# Patient Record
Sex: Female | Born: 1995
Health system: Southern US, Community
[De-identification: ages and names within clinical notes are randomized; demographics above are authoritative.]

## PROBLEM LIST (undated history)

## (undated) DIAGNOSIS — E282 Polycystic ovarian syndrome: Secondary | ICD-10-CM

## (undated) DIAGNOSIS — E079 Disorder of thyroid, unspecified: Secondary | ICD-10-CM

## (undated) DIAGNOSIS — R51 Headache: Secondary | ICD-10-CM

---
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
## 2014-04-24 ENCOUNTER — Encounter (HOSPITAL_COMMUNITY): Payer: Self-pay | Admitting: Emergency Medicine

## 2014-04-24 ENCOUNTER — Emergency Department (HOSPITAL_COMMUNITY)
Admission: EM | Admit: 2014-04-24 | Discharge: 2014-04-25 | Disposition: A | Discharge status: Home / Self Care | Payer: Managed Care, Other (non HMO) | Attending: Emergency Medicine | Admitting: Emergency Medicine

## 2014-04-24 DIAGNOSIS — H9209 Otalgia, unspecified ear: Secondary | ICD-10-CM | POA: Insufficient documentation

## 2014-04-24 DIAGNOSIS — Z79899 Other long term (current) drug therapy: Secondary | ICD-10-CM | POA: Diagnosis not present

## 2014-04-24 DIAGNOSIS — H9201 Otalgia, right ear: Secondary | ICD-10-CM

## 2014-04-24 NOTE — ED Notes (Signed)
Pain rt ear for 2 weeks

## 2014-04-25 NOTE — ED Provider Notes (Signed)
Medical screening examination/treatment/procedure(s) were performed by non-physician practitioner and as supervising physician I was immediately available for consultation/collaboration.   EKG Interpretation None        Taleigha Pinson, MD 04/25/14 0259 

## 2014-04-25 NOTE — ED Provider Notes (Signed)
CSN: [id number]     Arrival date & time 04/24/14  2238 History   First MD Initiated Contact with Patient 04/25/14 0007     Chief Complaint  Patient presents with  . Otalgia     (Consider location/radiation/quality/duration/timing/severity/associated sxs/prior Treatment) Patient is a 18 y.o. female presenting with ear pain. The history is provided by the patient.  Otalgia Location:  Right Behind ear:  No abnormality Quality:  Dull (Patient states the discomfort feels as though her hand is underwater, and someone is stabbing on a bucket or toe while her head is under the.) Severity:  Moderate Onset quality:  Gradual Duration:  2 weeks Timing:  Intermittent Progression:  Worsening Chronicity:  New Context: not direct blow, not loud noise and no water in ear   Relieved by:  Nothing Worsened by:  Nothing tried Ineffective treatments:  None tried Associated symptoms: no ear discharge, no fever, no headaches, no neck pain and no rhinorrhea   Risk factors: no recent travel     Past Medical History  Diagnosis Date  . Headache(784.0)    History reviewed. No pertinent past surgical history. History reviewed. No pertinent family history. History  Substance Use Topics  . Smoking status: Never Smoker   . Smokeless tobacco: Not on file  . Alcohol Use: No   OB History   Grav Para Term Preterm Abortions TAB SAB Ect Mult Living                 Review of Systems  Constitutional: Negative for fever.  HENT: Positive for ear pain. Negative for ear discharge and rhinorrhea.   Musculoskeletal: Negative for neck pain.  Neurological: Negative for headaches.      Allergies  Review of patient's allergies indicates no known allergies.  Home Medications   Prior to Admission medications   Medication Sig Start Date End Date Taking? Authorizing Provider  albuterol (PROVENTIL HFA) 108 (90 BASE) MCG/ACT inhaler Inhale 2 puffs into the lungs daily. Taken 15 minutes prior to physical activity      Historical Provider, MD  Norgestimate-Ethinyl Estradiol Triphasic (TRI-SPRINTEC) 0.18/0.215/0.25 MG-35 MCG tablet Take 1 tablet by mouth at bedtime.      Historical Provider, MD   BP 117/74  Pulse 86  Temp(Src) 97.9 F (36.6 C) (Oral)  Resp 18  Ht 5' 1.5" (1.562 m)  Wt 149 lb (67.586 kg)  BMI 27.70 kg/m2  SpO2 99%  LMP 04/09/2014 Physical Exam  Nursing note and vitals reviewed. Constitutional: She is oriented to person, place, and time. She appears well-developed and well-nourished.  Non-toxic appearance.  HENT:  Head: Normocephalic.  Right Ear: Tympanic membrane and external ear normal.  Left Ear: Tympanic membrane and external ear normal.  Moderate increase redness of the posterior pharynx. Mild enlargement of the tonsils. No exudate.  Eyes: EOM and lids are normal. Pupils are equal, round, and reactive to light.  Neck: Normal range of motion. Neck supple. Carotid bruit is not present.  Cardiovascular: Normal rate, regular rhythm, normal heart sounds, intact distal pulses and normal pulses.   Pulmonary/Chest: Breath sounds normal. No respiratory distress.  Abdominal: Soft. Bowel sounds are normal. There is no tenderness. There is no guarding.  Musculoskeletal: Normal range of motion.  Lymphadenopathy:       Head (right side): No submandibular adenopathy present.       Head (left side): No submandibular adenopathy present.    She has no cervical adenopathy.  Neurological: She is alert and oriented to person, place, and  time. She has normal strength. No cranial nerve deficit or sensory deficit.  Skin: Skin is warm and dry.  Psychiatric: She has a normal mood and affect. Her speech is normal.    ED Course  Procedures (including critical care time) Labs Review Labs Reviewed - No data to display  Imaging Review No results found.   EKG Interpretation None      MDM Vital signs are well within normal limits. No acute changes of the right ear. There is mod increase  redness of the posterior pharynx and what appears to be some enlargement of the tonsils. Question if the ear discomfort may be related to a eustachian tube problem. I have suggested to the patient that she see an ear nose and throat specialist, I reassured the patient that there is no acute changes or acute problem tonight.    Final diagnoses:  None    *I have reviewed nursing notes, vital signs, and all appropriate lab and imaging results for this patient.Kathie Dike**    Arvo Ealy M Lockie Bothun, PA-C 04/25/14 [phone]

## 2014-04-25 NOTE — Discharge Instructions (Signed)
Salt water gargles maybe helpful three  times daily. please see the ear nose and throat specialist listed above for evaluation and management of this year discomfort. Otalgia Otalgia is pain in or around the ear. When the pain is from the ear itself it is called primary otalgia. Pain may also be coming from somewhere else, like the head and neck. This is called secondary otalgia.  CAUSES  Causes of primary otalgia include:  Middle ear infection.  It can also be caused by injury to the ear or infection of the ear canal (swimmer's ear). Swimmer's ear causes pain, swelling and often drainage from the ear canal. Causes of secondary otalgia include:  Sinus infections.  Allergies and colds that cause stuffiness of the nose and tubes that drain the ears (eustachian tubes).  Dental problems like cavities, gum infections or teething.  Sore Throat (tonsillitis and pharyngitis).  Swollen glands in the neck.  Infection of the bone behind the ear (mastoiditis).  TMJ discomfort (problems with the joint between your jaw and your skull).  Other problems such as nerve disorders, circulation problems, heart disease and tumors of the head and neck can also cause symptoms of ear pain. This is rare. DIAGNOSIS  Evaluation, Diagnosis and Testing:  Examination by your medical caregiver is recommended to evaluate and diagnose the cause of otalgia.  Further testing or referral to a specialist may be indicated if the cause of the ear pain is not found and the symptom persists. TREATMENT   Your doctor may prescribe antibiotics if an ear infection is diagnosed.  Pain relievers and topical analgesics may be recommended.  It is important to take all medications as prescribed. HOME CARE INSTRUCTIONS   It may be helpful to sleep with the painful ear in the up position.  A warm compress over the painful ear may provide relief.  A soft diet and avoiding gum may help while ear pain is present. SEEK  IMMEDIATE MEDICAL CARE IF:  You develop severe pain, a high fever, repeated vomiting or dehydration.  You develop extreme dizziness, headache, confusion, ringing in the ears (tinnitus) or hearing loss. Document Released: 11/12/2004 Document Revised: 12/28/2011 Document Reviewed: 08/14/2009 [facility] Patient Information 2015 [employer], [place]. This information is not intended to replace advice given to you by your health care provider. Make sure you discuss any questions you have with your health care provider.

## 2014-09-11 ENCOUNTER — Encounter: Payer: Self-pay | Admitting: *Deleted

## 2015-02-06 IMAGING — US US PELVIS COMPLETE
1 series · 13 of 25 positions shown · non-contrast
Comparison: Pelvic ultrasound, 03/23/2011

CLINICAL DATA: The left lower quadrant pain.  History of left
ovarian cyst.

TRANSABDOMINAL AND TRANSVAGINAL ULTRASOUND OF PELVIS
TECHNIQUE: Both transabdominal and transvaginal ultrasound
examinations of the pelvis were performed. Transabdominal technique
was performed for global imaging of the pelvis including uterus,
ovaries, adnexal regions, and pelvic cul-de-sac.
It was necessary to proceed with endovaginal exam following the
transabdominal exam to visualize the uterus and ovaries to better
advantage.

[Series 1: us pelvis complete · 0.22mm/px · 13 of 74 slices shown]
[im 1/74]
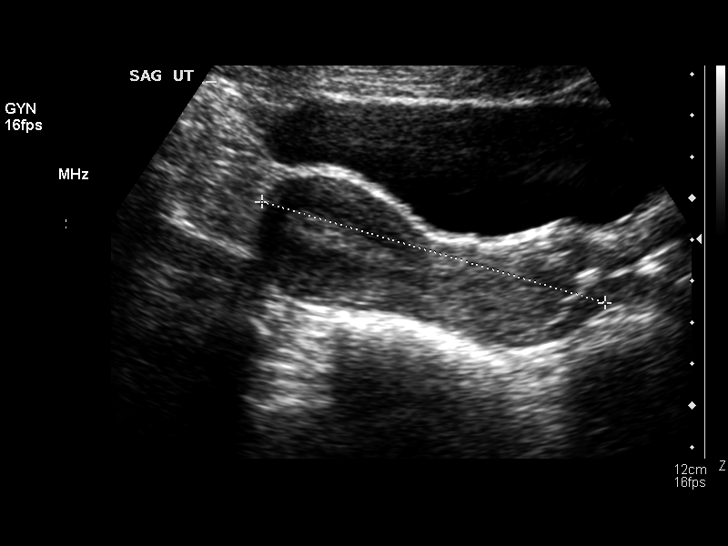
[im 7/74]
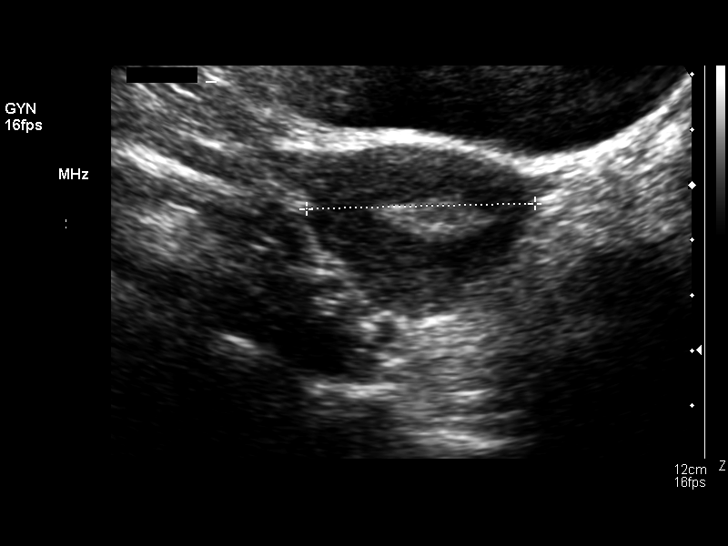
[im 13/74]
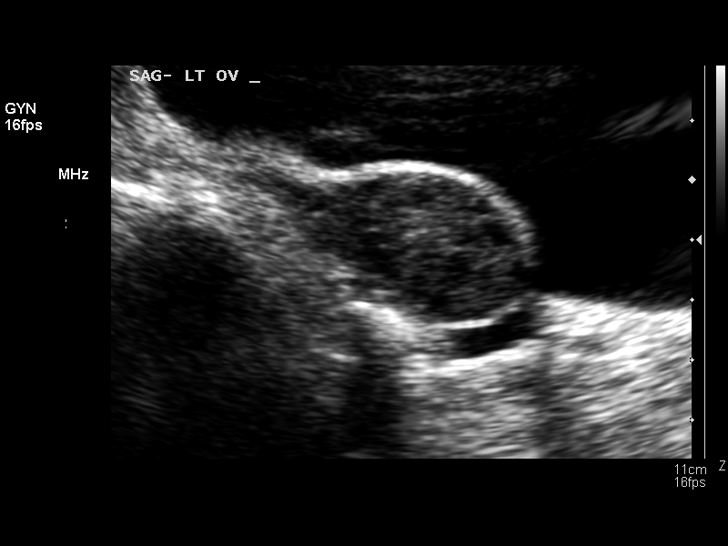
[im 19/74]
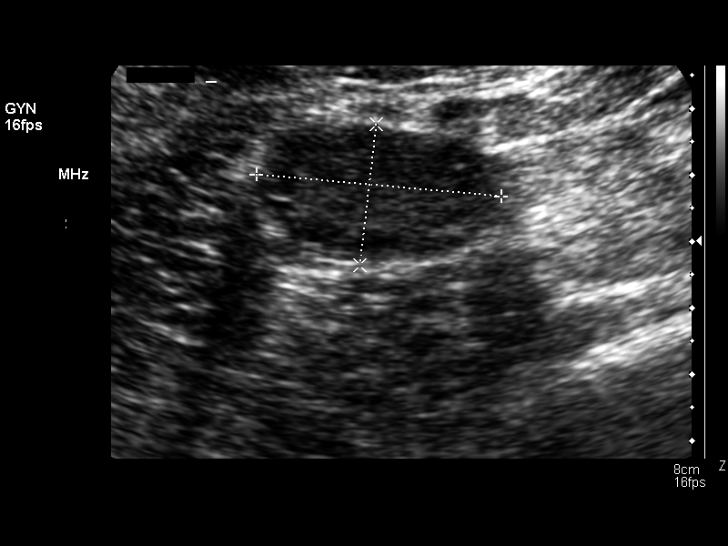
[im 25/74]
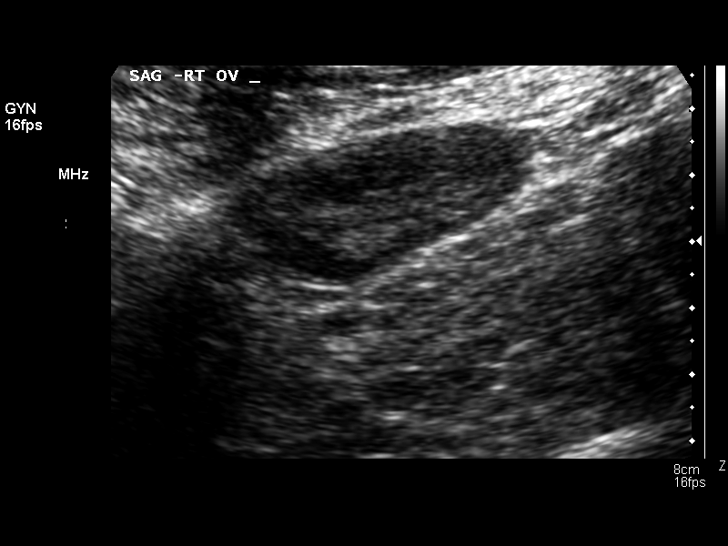
[im 31/74]
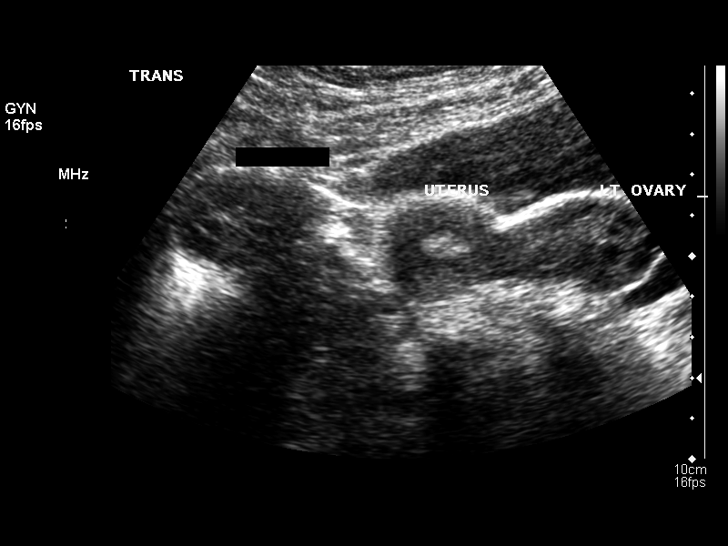
[im 37/74]
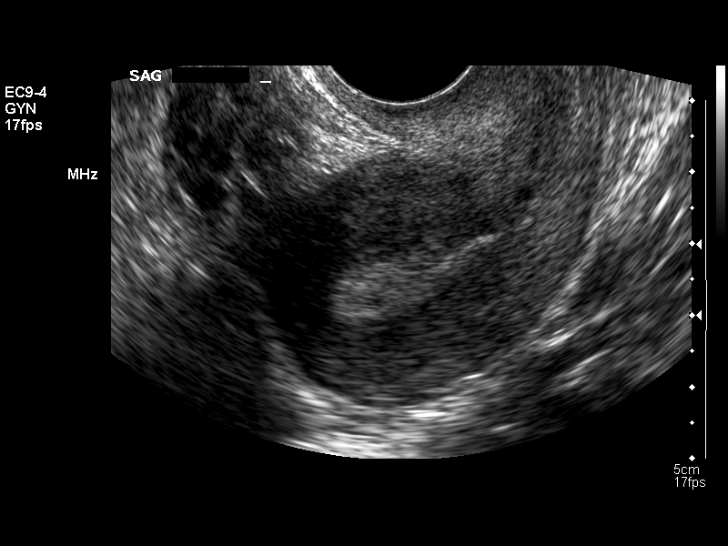
[im 43/74]
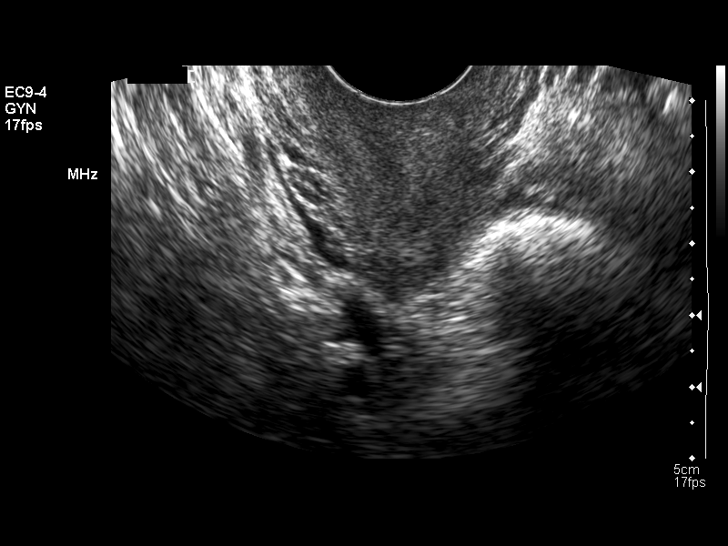
[im 49/74]
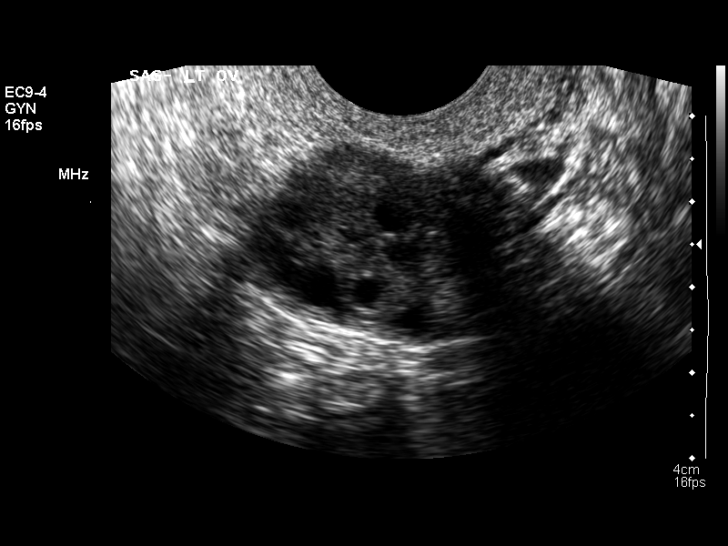
[im 55/74]
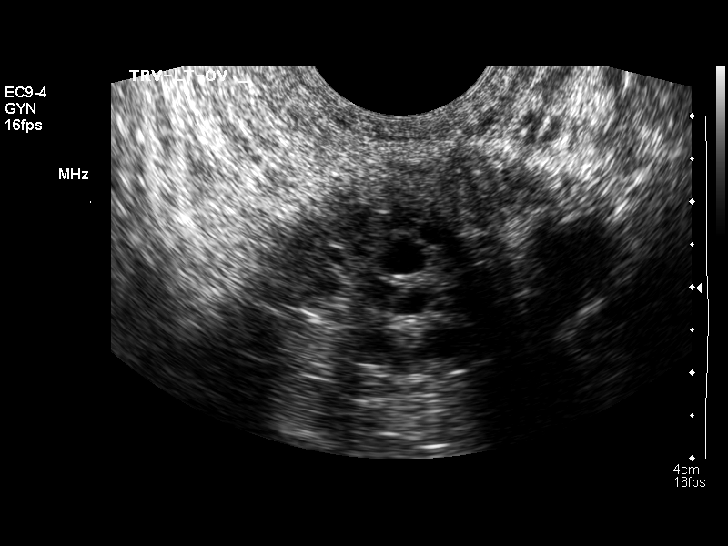
[im 61/74]
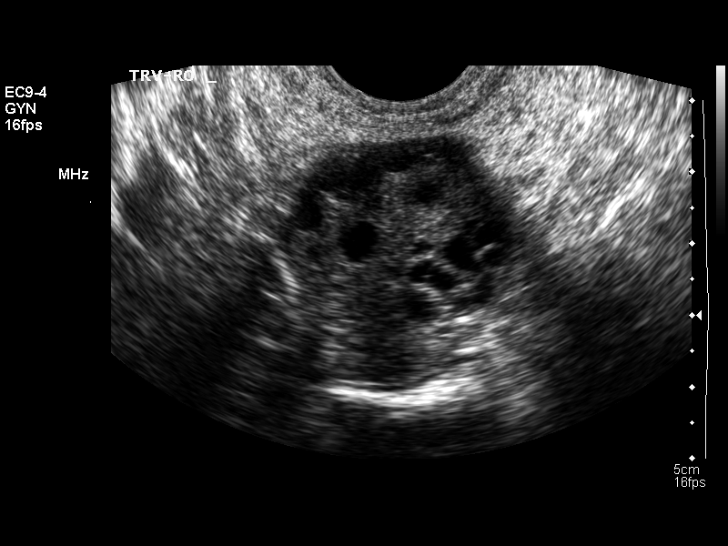
[im 67/74]
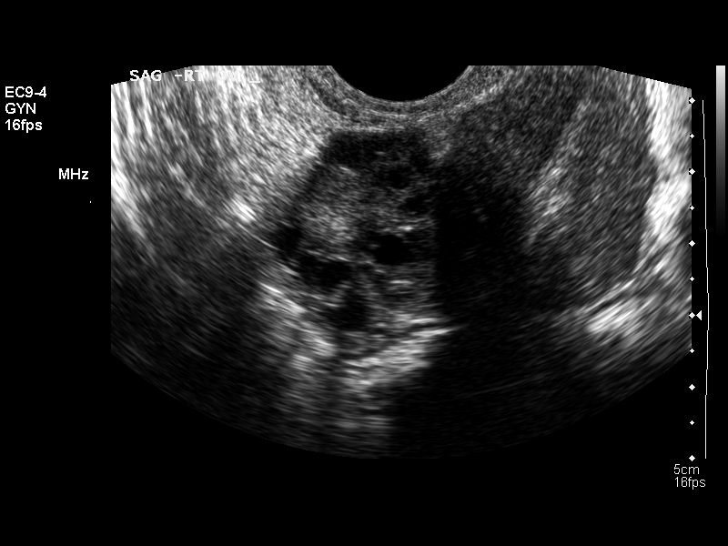
[im 74/74]
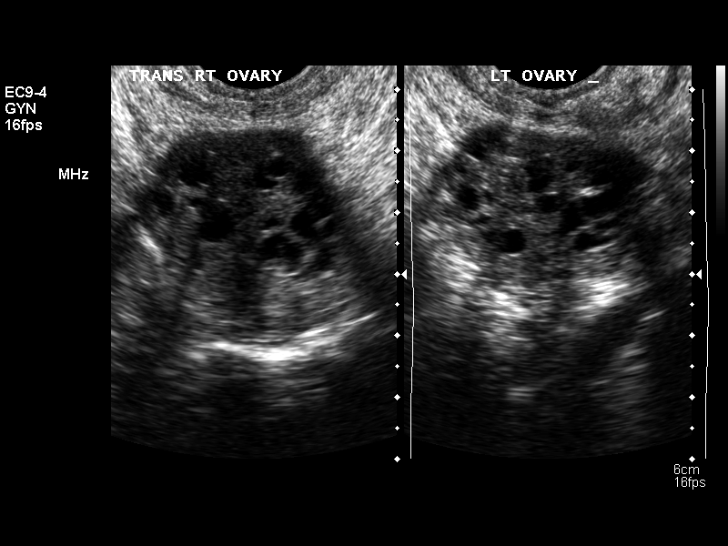

[13 of 25 positions shown; findings below may reference images not displayed]

FINDINGS: Uterus: The uterus is normal in size and echogenicity with no
masses.  The cervix is unremarkable.  The uterus measures 6.8 cm x
3.7 cm x 4.0 cm.

Endometrium: Normal in thickness and smooth measuring 10.8 mm.

Right ovary:  Normal in size and echogenicity.  There are multiple
small follicular cysts.  Normal blood flow is seen by color Doppler
analysis.  The ovary measures 33 mm x 37 mm x 27 mm.

Left ovary: Normal in size and echogenicity.  Multiple small
follicular cysts are noted similar to the right ovary.  Normal
blood flow is seen by color Doppler analysis.  The ovary measures
41 mm x 26 mm x 28 mm.

Other findings: Trace free fluid, physiologic.
IMPRESSION: Normal transabdominal and endovaginal pelvic ultrasound..

## 2017-01-14 DIAGNOSIS — Z0000 Encounter for general adult medical examination without abnormal findings: Secondary | ICD-10-CM | POA: Diagnosis not present

## 2017-01-14 DIAGNOSIS — Z23 Encounter for immunization: Secondary | ICD-10-CM | POA: Diagnosis not present

## 2017-02-02 DIAGNOSIS — Z124 Encounter for screening for malignant neoplasm of cervix: Secondary | ICD-10-CM | POA: Diagnosis not present

## 2017-02-02 DIAGNOSIS — L680 Hirsutism: Secondary | ICD-10-CM | POA: Diagnosis not present

## 2017-02-02 DIAGNOSIS — Z6828 Body mass index (BMI) 28.0-28.9, adult: Secondary | ICD-10-CM | POA: Diagnosis not present

## 2017-02-02 DIAGNOSIS — N938 Other specified abnormal uterine and vaginal bleeding: Secondary | ICD-10-CM | POA: Diagnosis not present

## 2017-03-03 DIAGNOSIS — E282 Polycystic ovarian syndrome: Secondary | ICD-10-CM | POA: Diagnosis not present

## 2017-03-18 DIAGNOSIS — E039 Hypothyroidism, unspecified: Secondary | ICD-10-CM | POA: Diagnosis not present

## 2018-05-25 DIAGNOSIS — L309 Dermatitis, unspecified: Secondary | ICD-10-CM | POA: Diagnosis not present

## 2018-05-25 DIAGNOSIS — B372 Candidiasis of skin and nail: Secondary | ICD-10-CM | POA: Diagnosis not present

## 2018-06-28 DIAGNOSIS — Z01419 Encounter for gynecological examination (general) (routine) without abnormal findings: Secondary | ICD-10-CM | POA: Diagnosis not present

## 2018-06-28 DIAGNOSIS — E039 Hypothyroidism, unspecified: Secondary | ICD-10-CM | POA: Diagnosis not present

## 2018-06-28 DIAGNOSIS — Z6828 Body mass index (BMI) 28.0-28.9, adult: Secondary | ICD-10-CM | POA: Diagnosis not present

## 2018-08-16 DIAGNOSIS — E039 Hypothyroidism, unspecified: Secondary | ICD-10-CM | POA: Diagnosis not present

## 2019-12-03 ENCOUNTER — Encounter (HOSPITAL_COMMUNITY): Payer: Self-pay | Admitting: *Deleted

## 2019-12-03 ENCOUNTER — Emergency Department (HOSPITAL_COMMUNITY)
Admission: EM | Admit: 2019-12-03 | Discharge: 2019-12-03 | Disposition: A | Discharge status: Home / Self Care | Payer: No Typology Code available for payment source | Attending: Emergency Medicine | Admitting: Emergency Medicine

## 2019-12-03 ENCOUNTER — Other Ambulatory Visit: Payer: Self-pay

## 2019-12-03 DIAGNOSIS — Z793 Long term (current) use of hormonal contraceptives: Secondary | ICD-10-CM | POA: Insufficient documentation

## 2019-12-03 DIAGNOSIS — M545 Low back pain: Secondary | ICD-10-CM | POA: Diagnosis present

## 2019-12-03 DIAGNOSIS — R519 Headache, unspecified: Secondary | ICD-10-CM | POA: Insufficient documentation

## 2019-12-03 DIAGNOSIS — Y92410 Unspecified street and highway as the place of occurrence of the external cause: Secondary | ICD-10-CM | POA: Diagnosis not present

## 2019-12-03 DIAGNOSIS — Y999 Unspecified external cause status: Secondary | ICD-10-CM | POA: Diagnosis not present

## 2019-12-03 DIAGNOSIS — Y93I9 Activity, other involving external motion: Secondary | ICD-10-CM | POA: Diagnosis not present

## 2019-12-03 MED ORDER — METHOCARBAMOL 500 MG PO TABS: 500.0000 mg | ORAL_TABLET | Freq: Three times a day (TID) | ORAL | 0 refills | Status: DC | PRN

## 2019-12-03 MED ORDER — NAPROXEN 500 MG PO TABS: 500.0000 mg | ORAL_TABLET | Freq: Two times a day (BID) | ORAL | 0 refills | Status: DC
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
## 2021-01-15 DIAGNOSIS — Z6828 Body mass index (BMI) 28.0-28.9, adult: Secondary | ICD-10-CM | POA: Diagnosis not present

## 2021-01-15 DIAGNOSIS — Z01419 Encounter for gynecological examination (general) (routine) without abnormal findings: Secondary | ICD-10-CM | POA: Diagnosis not present

## 2021-01-15 DIAGNOSIS — Z124 Encounter for screening for malignant neoplasm of cervix: Secondary | ICD-10-CM | POA: Diagnosis not present

## 2021-01-15 DIAGNOSIS — E039 Hypothyroidism, unspecified: Secondary | ICD-10-CM | POA: Diagnosis not present

## 2021-01-17 DIAGNOSIS — M79639 Pain in unspecified forearm: Secondary | ICD-10-CM | POA: Diagnosis not present

## 2021-01-21 DIAGNOSIS — S5001XA Contusion of right elbow, initial encounter: Secondary | ICD-10-CM | POA: Diagnosis not present

## 2021-04-03 DIAGNOSIS — Z03818 Encounter for observation for suspected exposure to other biological agents ruled out: Secondary | ICD-10-CM | POA: Diagnosis not present

## 2021-04-03 DIAGNOSIS — J069 Acute upper respiratory infection, unspecified: Secondary | ICD-10-CM | POA: Diagnosis not present

## 2021-04-03 DIAGNOSIS — R0981 Nasal congestion: Secondary | ICD-10-CM | POA: Diagnosis not present

## 2021-04-05 ENCOUNTER — Emergency Department (HOSPITAL_COMMUNITY)
Admission: EM | Admit: 2021-04-05 | Discharge: 2021-04-05 | Disposition: A | Discharge status: Home / Self Care | Payer: BC Managed Care – PPO | Attending: Emergency Medicine | Admitting: Emergency Medicine

## 2021-04-05 ENCOUNTER — Other Ambulatory Visit: Payer: Self-pay

## 2021-04-05 ENCOUNTER — Encounter (HOSPITAL_COMMUNITY): Payer: Self-pay | Admitting: Emergency Medicine

## 2021-04-05 DIAGNOSIS — T50901A Poisoning by unspecified drugs, medicaments and biological substances, accidental (unintentional), initial encounter: Secondary | ICD-10-CM

## 2021-04-05 DIAGNOSIS — T391X1A Poisoning by 4-Aminophenol derivatives, accidental (unintentional), initial encounter: Secondary | ICD-10-CM | POA: Insufficient documentation

## 2021-04-05 DIAGNOSIS — X58XXXA Exposure to other specified factors, initial encounter: Secondary | ICD-10-CM | POA: Insufficient documentation

## 2021-04-05 DIAGNOSIS — R42 Dizziness and giddiness: Secondary | ICD-10-CM | POA: Diagnosis not present

## 2021-04-05 DIAGNOSIS — R000 Tachycardia, unspecified: Secondary | ICD-10-CM | POA: Insufficient documentation

## 2021-04-05 HISTORY — DX: Disorder of thyroid, unspecified: E07.9

## 2021-04-05 HISTORY — DX: Polycystic ovarian syndrome: E28.2

## 2021-04-05 LAB — ACETAMINOPHEN LEVEL: Acetaminophen (Tylenol), Serum: <10 ug/mL — ABNORMAL LOW (ref 10–30)

## 2021-04-05 NOTE — ED Provider Notes (Signed)
[facility] EMERGENCY DEPARTMENT Provider Note   CSN: [id number] Arrival date & time: 04/05/21  1848     History Chief Complaint  Patient presents with   Medication Reaction    Gail Lee is a 25 y.o. female.  HPI  Patient with significant medical history of headaches, PCOS, thyroid disease presents with chief complaint of overdosing on medication.  Patient states she has been taking Tylenol and Mucinex for a URI.  Patient states she she has been trying to take them every 4 hours as directed.  Unfortunately she accidentally double dosed on Tylenol and Mucinex.  Patient states today she had a total of 2000 mg of Tylenol, states that she took 2  (500 mg tablets) within an hour time span and and then took 2 over-the-counter Mucinex which is (400 mg of Guaifensin mixed with 20mg  of dextromethorphan) within an hour time period.  Patient states this occurred around 4 PM today, states that initially she felt lightheaded, and dizziness and felt that her mouth was very dry.  Patient states this has since resolved and has no other complaints at this time.  Patient states this was accidental and did not mean to harm her self.  Patient denies  relieving factors.  Patient denies headaches, fevers, chills, shortness of breath, chest pain, abdominal pain, nausea or vomiting.  Past Medical History:  Diagnosis Date   Headache(784.0)    PCOS (polycystic ovarian syndrome)    Thyroid disease     There are no problems to display for this patient.   History reviewed. No pertinent surgical history.   OB History   No obstetric history on file.     No family history on file.  Social History   Tobacco Use   Smoking status: Never   Smokeless tobacco: Never  Vaping Use   Vaping Use: Never used  Substance Use Topics   Alcohol use: No   Drug use: No    Home Medications Prior to Admission medications   Medication Sig Start Date End Date Taking? Authorizing Provider  albuterol (PROVENTIL  HFA) 108 (90 BASE) MCG/ACT inhaler Inhale 2 puffs into the lungs daily. Taken 15 minutes prior to physical activity     [provider]  methocarbamol (ROBAXIN) 500 MG tablet Take 1 tablet (500 mg total) by mouth every 8 (eight) hours as needed for muscle spasms. 12/03/19   Petrucelli, Samantha R, PA-C  naproxen (NAPROSYN) 500 MG tablet Take 1 tablet (500 mg total) by mouth 2 (two) times daily. 12/03/19   Petrucelli, Samantha R, PA-C  Norgestimate-Ethinyl Estradiol Triphasic (TRI-SPRINTEC) 0.18/0.215/0.25 MG-35 MCG tablet Take 1 tablet by mouth at bedtime.      [provider]    Allergies    Patient has no known allergies.  Review of Systems   Review of Systems  Constitutional:  Negative for chills and fever.  HENT:  Negative for congestion.   Respiratory:  Negative for shortness of breath.   Cardiovascular:  Negative for chest pain.  Gastrointestinal:  Negative for abdominal pain.  Genitourinary:  Negative for enuresis.  Musculoskeletal:  Negative for back pain.  Skin:  Negative for rash.  Neurological:  Negative for dizziness.  Hematological:  Does not bruise/bleed easily.   Physical Exam Updated Vital Signs BP 126/85 (BP Location: Right Arm)   Pulse (!) 102   Temp 98.2 F (36.8 C)   Resp 16   Ht 5\' 2"  (1.575 m)   Wt 71.7 kg   LMP 03/02/2021  SpO2 99%   BMI 28.91 kg/m   Physical Exam Vitals and nursing note reviewed.  Constitutional:      General: She is not in acute distress.    Appearance: Normal appearance. She is not ill-appearing or diaphoretic.  HENT:     Head: Normocephalic and atraumatic.     Nose: No congestion or rhinorrhea.  Eyes:     Conjunctiva/sclera: Conjunctivae normal.  Cardiovascular:     Rate and Rhythm: Regular rhythm. Tachycardia present.  Pulmonary:     Effort: Pulmonary effort is normal.  Musculoskeletal:        General: Normal range of motion.     Cervical back: Neck supple.  Skin:    General: Skin is warm and dry.      Coloration: Skin is not jaundiced.  Neurological:     Mental Status: She is alert and oriented to person, place, and time.  Psychiatric:        Mood and Affect: Mood normal.    ED Results / Procedures / Treatments   Labs (all labs ordered are listed, but only abnormal results are displayed) Labs Reviewed  ACETAMINOPHEN LEVEL - Abnormal; Notable for the following components:      Result Value   Acetaminophen (Tylenol), Serum <10 (*)    All other components within normal limits    EKG None  Radiology No results found.  Procedures Procedures   Medications Ordered in ED Medications - No data to display  ED Course  I have reviewed the triage vital signs and the nursing notes.  Pertinent labs & imaging results that were available during my care of the patient were reviewed by me and considered in my medical decision making (see chart for details).    MDM Rules/Calculators/A&P                         Initial impression-patient presents with concerns of possible overdose.  She is alert, does not appear in acute stress, vital signs notable for tachycardia.  Will consult with poison control for further recommendations.  Work-up-Tylenol level was less than 10  Consult-spoke with Michael from poison control, he states this is well below the toxic level for either  medication, he recommend obtaining a Tylenol level, and advise if greater than 100 to call back for further recommendations.  If negative patient can be discharged home.  Rule out-I have low suspicion for suicidal or homicidal ideations patient denies this, low suspicion for acute liver injury as Tylenol level is within normal limits, patient is nonjaundiced on my exam, she denies any abdominal pain.  Low suspicion for systemic infection as patient is nontoxic-appearing, vital signs reassuring.  Patient is tachycardic but she states she is anxious,  suspect this is secondary due to whitecoat syndrome.  Plan- Accidental  overdose since resolved- will educate patient on over-the-counter medication, and how to contact poison control if needed, patient to follow-up with her PCP as needed.  Vital signs have remained stable, no indication for hospital admission.  Patient discussed with attending and they agreed with assessment and plan.  Patient given at home care as well strict return precautions.  Patient verbalized that they understood agreed to said plan.  Final Clinical Impression(s) / ED Diagnoses Final diagnoses:  Medication administered in error, accidental or unintentional, initial encounter    Rx / DC Orders ED Discharge Orders     None        Carroll Sage, PA-C 04/05/21  2250    Vanetta Mulders, MD 04/11/21 [phone]

## 2021-04-05 NOTE — Discharge Instructions (Addendum)
Lab work is reassuring.  Please continue with medications as prescribed.  If questions about actions of poison COVID is can contact poison control.  Come back to the emergency department if you develop chest pain, shortness of breath, severe abdominal pain, uncontrolled nausea, vomiting, diarrhea.

## 2021-04-05 NOTE — ED Triage Notes (Signed)
Pt states that she thinks that she "double dosed herself on her Guaifenesin medication and states she took 1G of tylenol within 2-3 hours." Pt c/o an episode of dizziness and instant dry mouth.

## 2021-07-05 ENCOUNTER — Encounter (HOSPITAL_COMMUNITY): Payer: Self-pay | Admitting: *Deleted

## 2021-07-05 ENCOUNTER — Emergency Department (HOSPITAL_COMMUNITY): Payer: BC Managed Care – PPO

## 2021-07-05 ENCOUNTER — Observation Stay (HOSPITAL_COMMUNITY)
Admission: EM | Admit: 2021-07-05 | Discharge: 2021-07-06 | Disposition: A | Discharge status: Home / Self Care | Payer: BC Managed Care – PPO | Attending: General Surgery | Admitting: General Surgery

## 2021-07-05 ENCOUNTER — Other Ambulatory Visit: Payer: Self-pay

## 2021-07-05 DIAGNOSIS — E039 Hypothyroidism, unspecified: Secondary | ICD-10-CM | POA: Insufficient documentation

## 2021-07-05 DIAGNOSIS — K353 Acute appendicitis with localized peritonitis, without perforation or gangrene: Secondary | ICD-10-CM | POA: Diagnosis not present

## 2021-07-05 DIAGNOSIS — Z20822 Contact with and (suspected) exposure to covid-19: Secondary | ICD-10-CM | POA: Insufficient documentation

## 2021-07-05 DIAGNOSIS — R109 Unspecified abdominal pain: Secondary | ICD-10-CM | POA: Diagnosis not present

## 2021-07-05 DIAGNOSIS — K3580 Unspecified acute appendicitis: Secondary | ICD-10-CM | POA: Diagnosis not present

## 2021-07-05 DIAGNOSIS — Z79899 Other long term (current) drug therapy: Secondary | ICD-10-CM | POA: Diagnosis not present

## 2021-07-05 DIAGNOSIS — R102 Pelvic and perineal pain: Secondary | ICD-10-CM | POA: Diagnosis not present

## 2021-07-05 DIAGNOSIS — R1031 Right lower quadrant pain: Secondary | ICD-10-CM | POA: Diagnosis not present

## 2021-07-05 DIAGNOSIS — K37 Unspecified appendicitis: Secondary | ICD-10-CM | POA: Diagnosis present

## 2021-07-05 LAB — COMPREHENSIVE METABOLIC PANEL
ALT: 42 U/L (ref 0–44)
AST: 26 U/L (ref 15–41)
Albumin: 4.9 g/dL (ref 3.5–5.0)
Alkaline Phosphatase: 87 U/L (ref 38–126)
Anion gap: 9 (ref 5–15)
BUN: 10 mg/dL (ref 6–20)
CO2: 23 mmol/L (ref 22–32)
Calcium: 9 mg/dL (ref 8.9–10.3)
Chloride: 102 mmol/L (ref 98–111)
Creatinine, Ser: 0.62 mg/dL (ref 0.44–1.00)
GFR, Estimated: >60 mL/min (ref >60)
Glucose, Bld: 110 mg/dL — ABNORMAL HIGH (ref 70–99)
Potassium: 3.4 mmol/L — ABNORMAL LOW (ref 3.5–5.1)
Sodium: 134 mmol/L — ABNORMAL LOW (ref 135–145)
Total Bilirubin: 0.6 mg/dL (ref 0.3–1.2)
Total Protein: 7.9 g/dL (ref 6.5–8.1)

## 2021-07-05 LAB — CBC
HCT: 47.9 % — ABNORMAL HIGH (ref 36.0–46.0)
Hemoglobin: 15.8 g/dL — ABNORMAL HIGH (ref 12.0–15.0)
MCH: 32.7 pg (ref 26.0–34.0)
MCHC: 33 g/dL (ref 30.0–36.0)
MCV: 99.2 fL (ref 80.0–100.0)
Platelets: 286 10*3/uL (ref 150–400)
RBC: 4.83 MIL/uL (ref 3.87–5.11)
RDW: 11.5 % (ref 11.5–15.5)
WBC: 16.6 10*3/uL — ABNORMAL HIGH (ref 4.0–10.5)
nRBC: 0 % (ref 0.0–0.2)

## 2021-07-05 LAB — LIPASE, BLOOD: Lipase: 23 U/L (ref 11–51)

## 2021-07-05 LAB — POC URINE PREG, ED: Preg Test, Ur: NEGATIVE

## 2021-07-05 MED ORDER — MORPHINE SULFATE (PF) 4 MG/ML IV SOLN
4.0000 mg | Freq: Once | INTRAVENOUS | Status: AC
  Administered 2021-07-05: 4 mg via INTRAVENOUS
  Filled 2021-07-05: qty 1

## 2021-07-05 MED ORDER — METRONIDAZOLE 500 MG/100ML IV SOLN
500.0000 mg | Freq: Once | INTRAVENOUS | Status: AC
  Administered 2021-07-05: 500 mg via INTRAVENOUS
  Filled 2021-07-05: qty 100

## 2021-07-05 MED ORDER — MORPHINE SULFATE (PF) 4 MG/ML IV SOLN
4.0000 mg | INTRAVENOUS | Status: DC | PRN
  Administered 2021-07-05 – 2021-07-06 (×2): 4 mg via INTRAVENOUS
  Filled 2021-07-05 (×2): qty 1

## 2021-07-05 MED ORDER — SODIUM CHLORIDE 0.9 % IV SOLN
1.0000 g | Freq: Once | INTRAVENOUS | Status: AC
  Administered 2021-07-05: 1 g via INTRAVENOUS
  Filled 2021-07-05: qty 10

## 2021-07-05 MED ORDER — SODIUM CHLORIDE 0.9 % IV SOLN: INTRAVENOUS | Status: DC

## 2021-07-05 MED ORDER — IOHEXOL 350 MG/ML SOLN
80.0000 mL | Freq: Once | INTRAVENOUS | Status: AC | PRN
  Administered 2021-07-05: 80 mL via INTRAVENOUS

## 2021-07-05 MED ORDER — ONDANSETRON HCL 4 MG/2ML IJ SOLN
4.0000 mg | Freq: Once | INTRAMUSCULAR | Status: AC
  Administered 2021-07-05: 4 mg via INTRAVENOUS
  Filled 2021-07-05: qty 2

## 2021-07-05 MED ORDER — PIPERACILLIN-TAZOBACTAM 3.375 G IVPB
3.3750 g | Freq: Three times a day (TID) | INTRAVENOUS | Status: DC
  Administered 2021-07-06 (×2): 3.375 g via INTRAVENOUS
  Filled 2021-07-05 (×3): qty 50

## 2021-07-05 MED ORDER — LACTATED RINGERS IV BOLUS
1000.0000 mL | Freq: Once | INTRAVENOUS | Status: AC
  Administered 2021-07-05: 1000 mL via INTRAVENOUS

## 2021-07-05 MED ORDER — KETOROLAC TROMETHAMINE 30 MG/ML IJ SOLN
15.0000 mg | Freq: Once | INTRAMUSCULAR | Status: AC
  Administered 2021-07-05: 15 mg via INTRAVENOUS
  Filled 2021-07-05: qty 1

## 2021-07-05 NOTE — ED Provider Notes (Signed)
[facility] EMERGENCY DEPARTMENT Provider Note   CSN: [id number] Arrival date & time: 07/05/21  1651     History Chief Complaint  Patient presents with   Abdominal Pain    Gail Lee is a 25 y.o. female PMH PCOS who presents to the emergency department for evaluation of abdominal pain.  Abdominal pain began this morning and acutely worsened at 11 AM with associated nausea but no vomiting.  Nonradiating, 10 out of 10 located in the right lower quadrant in the suprapubic area.  Patient endorses intermittent vaginal spotting which she says is common for her in the setting of her PCOS.  Denies chest pain, shortness of breath, fever, cough, headache or other systemic symptoms.  Denies dysuria.   Abdominal Pain Associated symptoms: nausea   Associated symptoms: no chest pain, no chills, no cough, no dysuria, no fever, no hematuria, no shortness of breath, no sore throat and no vomiting       Past Medical History:  Diagnosis Date   Headache(784.0)    PCOS (polycystic ovarian syndrome)    Thyroid disease     There are no problems to display for this patient.   History reviewed. No pertinent surgical history.   OB History   No obstetric history on file.     No family history on file.  Social History   Tobacco Use   Smoking status: Never   Smokeless tobacco: Never  Vaping Use   Vaping Use: Never used  Substance Use Topics   Alcohol use: No   Drug use: No    Home Medications Prior to Admission medications   Medication Sig Start Date End Date Taking? Authorizing Provider  levothyroxine (SYNTHROID) 50 MCG tablet Take 50 mcg by mouth daily before breakfast.   Yes [provider]    Allergies    Patient has no known allergies.  Review of Systems   Review of Systems  Constitutional:  Negative for chills and fever.  HENT:  Negative for ear pain and sore throat.   Eyes:  Negative for pain and visual disturbance.  Respiratory:  Negative for cough and  shortness of breath.   Cardiovascular:  Negative for chest pain and palpitations.  Gastrointestinal:  Positive for abdominal pain and nausea. Negative for vomiting.  Genitourinary:  Negative for dysuria and hematuria.  Musculoskeletal:  Negative for arthralgias and back pain.  Skin:  Negative for color change and rash.  Neurological:  Negative for seizures and syncope.  All other systems reviewed and are negative.  Physical Exam Updated Vital Signs BP 117/88   Pulse (!) 115   Temp (!) 97.4 F (36.3 C)   Resp 20   Ht 5\' 1"  (1.549 m)   Wt 70.1 kg   LMP 06/20/2021 (Exact Date)   SpO2 100%   BMI 29.20 kg/m   Physical Exam Vitals and nursing note reviewed.  Constitutional:      General: She is not in acute distress.    Appearance: She is well-developed.  HENT:     Head: Normocephalic and atraumatic.  Eyes:     Conjunctiva/sclera: Conjunctivae normal.  Cardiovascular:     Rate and Rhythm: Normal rate and regular rhythm.     Heart sounds: No murmur heard. Pulmonary:     Effort: Pulmonary effort is normal. No respiratory distress.     Breath sounds: Normal breath sounds.  Abdominal:     Palpations: Abdomen is soft.     Tenderness: There is abdominal tenderness in the right  lower quadrant and suprapubic area.  Musculoskeletal:     Cervical back: Neck supple.  Skin:    General: Skin is warm and dry.  Neurological:     Mental Status: She is alert.    ED Results / Procedures / Treatments   Labs (all labs ordered are listed, but only abnormal results are displayed) Labs Reviewed  COMPREHENSIVE METABOLIC PANEL - Abnormal; Notable for the following components:      Result Value   Sodium 134 (*)    Potassium 3.4 (*)    Glucose, Bld 110 (*)    All other components within normal limits  CBC - Abnormal; Notable for the following components:   WBC 16.6 (*)    Hemoglobin 15.8 (*)    HCT 47.9 (*)    All other components within normal limits  LIPASE, BLOOD  URINALYSIS,  ROUTINE W REFLEX MICROSCOPIC  POC URINE PREG, ED  I-STAT BETA HCG BLOOD, ED ([facility], [facility], [facility] ONLY)    EKG None  Radiology US Transvaginal Non-OB  Result Date: 07/05/2021 CLINICAL DATA:  Rule out torsion.  Pelvic pain for 1 day. EXAM: TRANSABDOMINAL AND TRANSVAGINAL ULTRASOUND OF PELVIS DOPPLER ULTRASOUND OF OVARIES TECHNIQUE: Both transabdominal and transvaginal ultrasound examinations of the pelvis were performed. Transabdominal technique was performed for global imaging of the pelvis including uterus, ovaries, adnexal regions, and pelvic cul-de-sac. It was necessary to proceed with endovaginal exam following the transabdominal exam to visualize the ovaries and endometrium. Color and duplex Doppler ultrasound was utilized to evaluate blood flow to the ovaries. COMPARISON:  05/30/2013 FINDINGS: Uterus Measurements: 7.3 x 3.3 x 4 cm = volume: 50 mL. Anteverted uterus. No myometrial mass or abnormality identified. Endometrium Thickness: 11.5 mm.  No focal abnormality visualized. Right ovary Measurements: 4.4 x 3.1 x 4.2 cm = volume: 30 mL. Right ovary is prominent in size with multiple follicles arranged peripherally consistent with history of polycystic ovary disease. No dominant mass identified. Flow is demonstrated in the right ovary on color flow Doppler imaging. Left ovary Measurements: 5.2 x 2.8 x 3.7 cm = volume: 28 mL. Left ovary is prominent in size with multiple follicles arranged peripherally consistent with history of polycystic ovarian disease. No dominant mass identified. Flow is demonstrated in the left ovary on color flow Doppler imaging. Pulsed Doppler evaluation of both ovaries demonstrates normal low-resistance arterial and venous waveforms. Other findings Small amount of free fluid in the cul-de-sac and around the ovaries. This is nonspecific but likely physiologic. IMPRESSION: 1. Appearance and configuration of the ovaries is consistent with history of polycystic ovarian disease. No evidence  of abnormal mass or torsion. 2. Small amount of free fluid is likely physiologic. 3. Normal ultrasound appearance of the uterus. Electronically Signed   By: Burman Nieves M.D.   On: 07/05/2021 21:39   US Pelvis Complete  Result Date: 07/05/2021 CLINICAL DATA:  Rule out torsion.  Pelvic pain for 1 day. EXAM: TRANSABDOMINAL AND TRANSVAGINAL ULTRASOUND OF PELVIS DOPPLER ULTRASOUND OF OVARIES TECHNIQUE: Both transabdominal and transvaginal ultrasound examinations of the pelvis were performed. Transabdominal technique was performed for global imaging of the pelvis including uterus, ovaries, adnexal regions, and pelvic cul-de-sac. It was necessary to proceed with endovaginal exam following the transabdominal exam to visualize the ovaries and endometrium. Color and duplex Doppler ultrasound was utilized to evaluate blood flow to the ovaries. COMPARISON:  05/30/2013 FINDINGS: Uterus Measurements: 7.3 x 3.3 x 4 cm = volume: 50 mL. Anteverted uterus. No myometrial mass or abnormality identified. Endometrium Thickness:  11.5 mm.  No focal abnormality visualized. Right ovary Measurements: 4.4 x 3.1 x 4.2 cm = volume: 30 mL. Right ovary is prominent in size with multiple follicles arranged peripherally consistent with history of polycystic ovary disease. No dominant mass identified. Flow is demonstrated in the right ovary on color flow Doppler imaging. Left ovary Measurements: 5.2 x 2.8 x 3.7 cm = volume: 28 mL. Left ovary is prominent in size with multiple follicles arranged peripherally consistent with history of polycystic ovarian disease. No dominant mass identified. Flow is demonstrated in the left ovary on color flow Doppler imaging. Pulsed Doppler evaluation of both ovaries demonstrates normal low-resistance arterial and venous waveforms. Other findings Small amount of free fluid in the cul-de-sac and around the ovaries. This is nonspecific but likely physiologic. IMPRESSION: 1. Appearance and configuration of the  ovaries is consistent with history of polycystic ovarian disease. No evidence of abnormal mass or torsion. 2. Small amount of free fluid is likely physiologic. 3. Normal ultrasound appearance of the uterus. Electronically Signed   By: Burman Nieves M.D.   On: 07/05/2021 21:39   CT ABDOMEN PELVIS W CONTRAST  Result Date: 07/05/2021 CLINICAL DATA:  Right lower quadrant abdominal pain since this morning. Nausea. EXAM: CT ABDOMEN AND PELVIS WITH CONTRAST TECHNIQUE: Multidetector CT imaging of the abdomen and pelvis was performed using the standard protocol following bolus administration of intravenous contrast. CONTRAST:  9mL OMNIPAQUE IOHEXOL 350 MG/ML SOLN COMPARISON:  11/19/2010 FINDINGS: Lower chest: Lung bases are clear. Hepatobiliary: No focal liver abnormality is seen. No gallstones, gallbladder wall thickening, or biliary dilatation. Pancreas: Unremarkable. No pancreatic ductal dilatation or surrounding inflammatory changes. Spleen: Normal in size without focal abnormality. Adrenals/Urinary Tract: Adrenal glands are unremarkable. Kidneys are normal, without renal calculi, focal lesion, or hydronephrosis. Bladder is unremarkable. Stomach/Bowel: Stomach, small bowel, and colon are not abnormally distended. No wall thickening or inflammatory changes are appreciated. The appendix is distended and fluid-filled with appendiceal diameter measuring about 1.3 cm. Minimal if any inflammatory stranding around the appendix. Minimal free fluid around the appendix. No loculated collections. Changes are likely to indicate early acute appendicitis although an appendiceal mucocele could possibly also have this appearance. Appendix: Location: Medial to the cecum extending towards the midline with tip anterior to the iliopsoas muscle. Diameter: 1.3 cm Appendicolith: No Mucosal hyper-enhancement: No Extraluminal gas: No Periappendiceal collection: No Vascular/Lymphatic: No significant vascular findings are present. No  enlarged abdominal or pelvic lymph nodes. Reproductive: Uterus is not enlarged. Prominent appearance of the ovaries, similar to prior study, consistent with history of polycystic ovarian disease. Other: Small amount of free fluid in the pelvis may be physiologic or reactive. Musculoskeletal: No acute or significant osseous findings. IMPRESSION: 1. Distended fluid-filled appendix at 1.3 cm diameter, likely appendicitis. No abscess. 2. Prominence of the ovaries consistent with history of polycystic ovarian disease. Electronically Signed   By: Burman Nieves M.D.   On: 07/05/2021 21:43   [place] Art/Ven Flow Abd Pelv Doppler  Result Date: 07/05/2021 CLINICAL DATA:  Rule out torsion.  Pelvic pain for 1 day. EXAM: TRANSABDOMINAL AND TRANSVAGINAL ULTRASOUND OF PELVIS DOPPLER ULTRASOUND OF OVARIES TECHNIQUE: Both transabdominal and transvaginal ultrasound examinations of the pelvis were performed. Transabdominal technique was performed for global imaging of the pelvis including uterus, ovaries, adnexal regions, and pelvic cul-de-sac. It was necessary to proceed with endovaginal exam following the transabdominal exam to visualize the ovaries and endometrium. Color and duplex Doppler ultrasound was utilized to evaluate blood flow to the ovaries. COMPARISON:  05/30/2013 FINDINGS: Uterus Measurements: 7.3 x 3.3 x 4 cm = volume: 50 mL. Anteverted uterus. No myometrial mass or abnormality identified. Endometrium Thickness: 11.5 mm.  No focal abnormality visualized. Right ovary Measurements: 4.4 x 3.1 x 4.2 cm = volume: 30 mL. Right ovary is prominent in size with multiple follicles arranged peripherally consistent with history of polycystic ovary disease. No dominant mass identified. Flow is demonstrated in the right ovary on color flow Doppler imaging. Left ovary Measurements: 5.2 x 2.8 x 3.7 cm = volume: 28 mL. Left ovary is prominent in size with multiple follicles arranged peripherally consistent with history of polycystic  ovarian disease. No dominant mass identified. Flow is demonstrated in the left ovary on color flow Doppler imaging. Pulsed Doppler evaluation of both ovaries demonstrates normal low-resistance arterial and venous waveforms. Other findings Small amount of free fluid in the cul-de-sac and around the ovaries. This is nonspecific but likely physiologic. IMPRESSION: 1. Appearance and configuration of the ovaries is consistent with history of polycystic ovarian disease. No evidence of abnormal mass or torsion. 2. Small amount of free fluid is likely physiologic. 3. Normal ultrasound appearance of the uterus. Electronically Signed   By: Burman Nieves M.D.   On: 07/05/2021 21:39    Procedures Procedures   Medications Ordered in ED Medications  cefTRIAXone (ROCEPHIN) 1 g in sodium chloride 0.9 % 100 mL IVPB (1 g Intravenous New Bag/Given 07/05/21 2203)  metroNIDAZOLE (FLAGYL) IVPB 500 mg (has no administration in time range)  ketorolac (TORADOL) 30 MG/ML injection 15 mg (15 mg Intravenous Given 07/05/21 2017)  morphine 4 MG/ML injection 4 mg (4 mg Intravenous Given 07/05/21 2018)  ondansetron (ZOFRAN) injection 4 mg (4 mg Intravenous Given 07/05/21 2015)  lactated ringers bolus 1,000 mL (0 mLs Intravenous Stopped 07/05/21 2205)  iohexol (OMNIPAQUE) 350 MG/ML injection 80 mL (80 mLs Intravenous Contrast Given 07/05/21 2124)    ED Course  I have reviewed the triage vital signs and the nursing notes.  Pertinent labs & imaging results that were available during my care of the patient were reviewed by me and considered in my medical decision making (see chart for details).    MDM Rules/Calculators/A&P                           Patient seen the emergency department for evaluation abdominal pain.  Physical exam reveals tenderness in the right lower quadrant and suprapubic areas with involuntary guarding.  Pregnancy test negative.  Pelvic ultrasound with evidence of PCOS but no evidence of torsion.  Laboratory  evaluation with leukocytosis to 16.6, hypokalemia 3.4 but is otherwise unremarkable.  CT abdomen pelvis with appendicitis.  Patient started on ceftriaxone Flagyl.  Surgery consulted who will take the patient for surgery at 9 AM tomorrow.  Patient admitted to medicine for management and ultimate surgery tomorrow. Final Clinical Impression(s) / ED Diagnoses Final diagnoses:  None    Rx / DC Orders ED Discharge Orders     None        Lliam Hoh, Wyn Forster, MD 07/05/21 2211

## 2021-07-05 NOTE — Progress Notes (Signed)
[facility]  Appendicitis on CT> plan for surgery around 9AM. Will meet patient in AM. Appreciate hospitalist helping with admission. NPO midnight.   Algis Greenhouse, MD [facility] [address] [place], [place] [address] [phone] (office)

## 2021-07-05 NOTE — ED Triage Notes (Signed)
Abdominal pain onset this ma, denies vomiting or diarrhea

## 2021-07-06 ENCOUNTER — Encounter (HOSPITAL_COMMUNITY): Payer: Self-pay | Admitting: Family Medicine

## 2021-07-06 ENCOUNTER — Encounter (HOSPITAL_COMMUNITY)
Admission: EM | Disposition: A | Discharge status: Home / Self Care | Payer: Self-pay | Source: Home / Self Care | Attending: Student

## 2021-07-06 ENCOUNTER — Inpatient Hospital Stay (HOSPITAL_COMMUNITY): Payer: BC Managed Care – PPO | Admitting: Anesthesiology

## 2021-07-06 DIAGNOSIS — K3580 Unspecified acute appendicitis: Secondary | ICD-10-CM

## 2021-07-06 DIAGNOSIS — E039 Hypothyroidism, unspecified: Secondary | ICD-10-CM | POA: Diagnosis not present

## 2021-07-06 DIAGNOSIS — K353 Acute appendicitis with localized peritonitis, without perforation or gangrene: Secondary | ICD-10-CM | POA: Diagnosis not present

## 2021-07-06 DIAGNOSIS — Z79899 Other long term (current) drug therapy: Secondary | ICD-10-CM | POA: Diagnosis not present

## 2021-07-06 DIAGNOSIS — K37 Unspecified appendicitis: Secondary | ICD-10-CM | POA: Diagnosis not present

## 2021-07-06 DIAGNOSIS — Z20822 Contact with and (suspected) exposure to covid-19: Secondary | ICD-10-CM | POA: Diagnosis not present

## 2021-07-06 HISTORY — PX: LAPAROSCOPIC APPENDECTOMY: SHX408

## 2021-07-06 LAB — CBC WITH DIFFERENTIAL/PLATELET
Abs Immature Granulocytes: 0.06 10*3/uL (ref 0.00–0.07)
Basophils Absolute: 0 10*3/uL (ref 0.0–0.1)
Basophils Relative: 0 %
Eosinophils Absolute: 0 10*3/uL (ref 0.0–0.5)
Eosinophils Relative: 0 %
HCT: 38.6 % (ref 36.0–46.0)
Hemoglobin: 12.9 g/dL (ref 12.0–15.0)
Immature Granulocytes: 0 %
Lymphocytes Relative: 16 %
Lymphs Abs: 2.2 10*3/uL (ref 0.7–4.0)
MCH: 33.1 pg (ref 26.0–34.0)
MCHC: 33.4 g/dL (ref 30.0–36.0)
MCV: 99 fL (ref 80.0–100.0)
Monocytes Absolute: 0.9 10*3/uL (ref 0.1–1.0)
Monocytes Relative: 6 %
Neutro Abs: 11.1 10*3/uL — ABNORMAL HIGH (ref 1.7–7.7)
Neutrophils Relative %: 78 %
Platelets: 255 10*3/uL (ref 150–400)
RBC: 3.9 MIL/uL (ref 3.87–5.11)
RDW: 11.8 % (ref 11.5–15.5)
WBC: 14.3 10*3/uL — ABNORMAL HIGH (ref 4.0–10.5)
nRBC: 0 % (ref 0.0–0.2)

## 2021-07-06 LAB — RESP PANEL BY RT-PCR (FLU A&B, COVID) ARPGX2
Influenza A by PCR: NEGATIVE
Influenza B by PCR: NEGATIVE
SARS Coronavirus 2 by RT PCR: NEGATIVE

## 2021-07-06 LAB — COMPREHENSIVE METABOLIC PANEL
ALT: 28 U/L (ref 0–44)
AST: 17 U/L (ref 15–41)
Albumin: 3.5 g/dL (ref 3.5–5.0)
Alkaline Phosphatase: 69 U/L (ref 38–126)
Anion gap: 4 — ABNORMAL LOW (ref 5–15)
BUN: 10 mg/dL (ref 6–20)
CO2: 27 mmol/L (ref 22–32)
Calcium: 8.1 mg/dL — ABNORMAL LOW (ref 8.9–10.3)
Chloride: 106 mmol/L (ref 98–111)
Creatinine, Ser: 0.56 mg/dL (ref 0.44–1.00)
GFR, Estimated: >60 mL/min (ref >60)
Glucose, Bld: 97 mg/dL (ref 70–99)
Potassium: 3.4 mmol/L — ABNORMAL LOW (ref 3.5–5.1)
Sodium: 137 mmol/L (ref 135–145)
Total Bilirubin: 0.7 mg/dL (ref 0.3–1.2)
Total Protein: 5.9 g/dL — ABNORMAL LOW (ref 6.5–8.1)

## 2021-07-06 LAB — HIV ANTIBODY (ROUTINE TESTING W REFLEX): HIV Screen 4th Generation wRfx: NONREACTIVE

## 2021-07-06 LAB — MAGNESIUM: Magnesium: 1.6 mg/dL — ABNORMAL LOW (ref 1.7–2.4)

## 2021-07-06 LAB — SURGICAL PCR SCREEN
MRSA, PCR: NEGATIVE
Staphylococcus aureus: NEGATIVE

## 2021-07-06 SURGERY — APPENDECTOMY, LAPAROSCOPIC
Anesthesia: General

## 2021-07-06 MED ORDER — ONDANSETRON HCL 4 MG/2ML IJ SOLN
INTRAMUSCULAR | Status: AC
  Filled 2021-07-06: qty 2

## 2021-07-06 MED ORDER — DEXAMETHASONE SODIUM PHOSPHATE 10 MG/ML IJ SOLN
INTRAMUSCULAR | Status: AC
  Filled 2021-07-06: qty 1

## 2021-07-06 MED ORDER — ONDANSETRON HCL 4 MG PO TABS: 4.0000 mg | ORAL_TABLET | Freq: Four times a day (QID) | ORAL | Status: DC | PRN

## 2021-07-06 MED ORDER — MEPERIDINE HCL 50 MG/ML IJ SOLN: 6.2500 mg | INTRAMUSCULAR | Status: DC | PRN

## 2021-07-06 MED ORDER — HYDROMORPHONE HCL 1 MG/ML IJ SOLN: 0.2500 mg | INTRAMUSCULAR | Status: DC | PRN

## 2021-07-06 MED ORDER — LIDOCAINE HCL (PF) 2 % IJ SOLN
INTRAMUSCULAR | Status: AC
  Filled 2021-07-06: qty 5

## 2021-07-06 MED ORDER — NALOXONE HCL 0.4 MG/ML IJ SOLN
INTRAMUSCULAR | Status: DC | PRN
  Administered 2021-07-06: 80 ug via INTRAVENOUS

## 2021-07-06 MED ORDER — PROPOFOL 10 MG/ML IV BOLUS
INTRAVENOUS | Status: DC | PRN
  Administered 2021-07-06: 150 mg via INTRAVENOUS

## 2021-07-06 MED ORDER — MIDAZOLAM HCL 5 MG/5ML IJ SOLN
INTRAMUSCULAR | Status: DC | PRN
  Administered 2021-07-06: 2 mg via INTRAVENOUS

## 2021-07-06 MED ORDER — ONDANSETRON HCL 4 MG/2ML IJ SOLN: 4.0000 mg | Freq: Four times a day (QID) | INTRAMUSCULAR | Status: DC | PRN

## 2021-07-06 MED ORDER — BUPIVACAINE LIPOSOME 1.3 % IJ SUSP
INTRAMUSCULAR | Status: AC
  Filled 2021-07-06: qty 20

## 2021-07-06 MED ORDER — LEVOTHYROXINE SODIUM 50 MCG PO TABS
50.0000 ug | ORAL_TABLET | Freq: Every day | ORAL | Status: DC
  Administered 2021-07-06: 50 ug via ORAL
  Filled 2021-07-06: qty 1

## 2021-07-06 MED ORDER — DEXAMETHASONE SODIUM PHOSPHATE 10 MG/ML IJ SOLN
INTRAMUSCULAR | Status: DC | PRN
  Administered 2021-07-06: 10 mg via INTRAVENOUS

## 2021-07-06 MED ORDER — SUGAMMADEX SODIUM 200 MG/2ML IV SOLN
INTRAVENOUS | Status: DC | PRN
  Administered 2021-07-06: 300 mg via INTRAVENOUS

## 2021-07-06 MED ORDER — LACTATED RINGERS IV SOLN: INTRAVENOUS | Status: DC | PRN

## 2021-07-06 MED ORDER — CHLORHEXIDINE GLUCONATE CLOTH 2 % EX PADS
6.0000 | MEDICATED_PAD | Freq: Once | CUTANEOUS | Status: AC
  Administered 2021-07-06: 6 via TOPICAL

## 2021-07-06 MED ORDER — PROPOFOL 10 MG/ML IV BOLUS
INTRAVENOUS | Status: AC
  Filled 2021-07-06: qty 20

## 2021-07-06 MED ORDER — ACETAMINOPHEN 650 MG RE SUPP: 650.0000 mg | Freq: Four times a day (QID) | RECTAL | Status: DC | PRN

## 2021-07-06 MED ORDER — OXYCODONE HCL 5 MG PO TABS: 5.0000 mg | ORAL_TABLET | ORAL | Status: DC | PRN

## 2021-07-06 MED ORDER — INFLUENZA VAC SPLIT QUAD 0.5 ML IM SUSY: 0.5000 mL | PREFILLED_SYRINGE | INTRAMUSCULAR | Status: DC | PRN

## 2021-07-06 MED ORDER — ACETAMINOPHEN 325 MG PO TABS: 650.0000 mg | ORAL_TABLET | Freq: Four times a day (QID) | ORAL | Status: DC | PRN

## 2021-07-06 MED ORDER — DEXMEDETOMIDINE (PRECEDEX) IN NS 20 MCG/5ML (4 MCG/ML) IV SYRINGE
PREFILLED_SYRINGE | INTRAVENOUS | Status: DC | PRN
  Administered 2021-07-06 (×2): 4 ug via INTRAVENOUS

## 2021-07-06 MED ORDER — ROCURONIUM BROMIDE 10 MG/ML (PF) SYRINGE
PREFILLED_SYRINGE | INTRAVENOUS | Status: DC | PRN
  Administered 2021-07-06: 40 mg via INTRAVENOUS

## 2021-07-06 MED ORDER — ROCURONIUM BROMIDE 10 MG/ML (PF) SYRINGE
PREFILLED_SYRINGE | INTRAVENOUS | Status: AC
  Filled 2021-07-06: qty 10

## 2021-07-06 MED ORDER — BUPIVACAINE LIPOSOME 1.3 % IJ SUSP
INTRAMUSCULAR | Status: DC | PRN
  Administered 2021-07-06: 20 mL

## 2021-07-06 MED ORDER — MIDAZOLAM HCL 2 MG/2ML IJ SOLN
INTRAMUSCULAR | Status: AC
  Filled 2021-07-06: qty 2

## 2021-07-06 MED ORDER — SODIUM CHLORIDE 0.9 % IR SOLN
Status: DC | PRN
  Administered 2021-07-06: 1000 mL

## 2021-07-06 MED ORDER — LIDOCAINE HCL (CARDIAC) PF 100 MG/5ML IV SOSY
PREFILLED_SYRINGE | INTRAVENOUS | Status: DC | PRN
  Administered 2021-07-06: 60 mg via INTRAVENOUS

## 2021-07-06 MED ORDER — ONDANSETRON HCL 4 MG PO TABS: 4.0000 mg | ORAL_TABLET | Freq: Four times a day (QID) | ORAL | 0 refills | Status: DC | PRN

## 2021-07-06 MED ORDER — OXYCODONE HCL 5 MG PO TABS: 5.0000 mg | ORAL_TABLET | ORAL | 0 refills | Status: DC | PRN

## 2021-07-06 MED ORDER — HEPARIN SODIUM (PORCINE) 5000 UNIT/ML IJ SOLN
5000.0000 [IU] | Freq: Three times a day (TID) | INTRAMUSCULAR | Status: DC
  Administered 2021-07-06: 5000 [IU] via SUBCUTANEOUS
  Filled 2021-07-06: qty 1

## 2021-07-06 MED ORDER — FENTANYL CITRATE (PF) 100 MCG/2ML IJ SOLN
INTRAMUSCULAR | Status: DC | PRN
  Administered 2021-07-06: 50 ug via INTRAVENOUS
  Administered 2021-07-06: 100 ug via INTRAVENOUS

## 2021-07-06 MED ORDER — FENTANYL CITRATE (PF) 100 MCG/2ML IJ SOLN
INTRAMUSCULAR | Status: AC
  Filled 2021-07-06: qty 4

## 2021-07-06 MED ORDER — SODIUM CHLORIDE 0.9 % IV SOLN
2.0000 g | INTRAVENOUS | Status: AC
  Administered 2021-07-06: 2 g via INTRAVENOUS
  Filled 2021-07-06 (×2): qty 2

## 2021-07-06 MED ORDER — ONDANSETRON HCL 4 MG/2ML IJ SOLN: 4.0000 mg | Freq: Once | INTRAMUSCULAR | Status: DC | PRN

## 2021-07-06 MED ORDER — SUCCINYLCHOLINE CHLORIDE 200 MG/10ML IV SOSY
PREFILLED_SYRINGE | INTRAVENOUS | Status: AC
  Filled 2021-07-06: qty 10

## 2021-07-06 MED ORDER — LIDOCAINE HCL URETHRAL/MUCOSAL 2 % EX GEL
CUTANEOUS | Status: DC | PRN
  Administered 2021-07-06: 1 via INTRATRACHEAL

## 2021-07-06 MED ORDER — ONDANSETRON HCL 4 MG/2ML IJ SOLN
INTRAMUSCULAR | Status: DC | PRN
  Administered 2021-07-06: 4 mg via INTRAVENOUS

## 2021-07-06 SURGICAL SUPPLY — 47 items
ADH SKN CLS APL DERMABOND .7 (GAUZE/BANDAGES/DRESSINGS) ×1
APL PRP STRL LF DISP 70% ISPRP (MISCELLANEOUS) ×1
BAG RETRIEVAL 10 (BASKET) ×1
BLADE SURG 15 STRL LF DISP TIS (BLADE) ×1 IMPLANT
BLADE SURG 15 STRL SS (BLADE) ×2
CHLORAPREP W/TINT 26 (MISCELLANEOUS) ×2 IMPLANT
CLOTH BEACON ORANGE TIMEOUT ST (SAFETY) ×2 IMPLANT
COVER LIGHT HANDLE STERIS (MISCELLANEOUS) ×4 IMPLANT
CUTTER FLEX LINEAR 45M (STAPLE) ×2 IMPLANT
DERMABOND ADVANCED (GAUZE/BANDAGES/DRESSINGS) ×1
DERMABOND ADVANCED .7 DNX12 (GAUZE/BANDAGES/DRESSINGS) ×1 IMPLANT
ELECT REM PT RETURN 9FT ADLT (ELECTROSURGICAL) ×2
ELECTRODE REM PT RTRN 9FT ADLT (ELECTROSURGICAL) ×1 IMPLANT
GLOVE SURG ENC MOIS LTX SZ6.5 (GLOVE) ×2 IMPLANT
GLOVE SURG UNDER POLY LF SZ6.5 (GLOVE) ×2 IMPLANT
GLOVE SURG UNDER POLY LF SZ7 (GLOVE) ×6 IMPLANT
GOWN STRL REUS W/TWL LRG LVL3 (GOWN DISPOSABLE) ×4 IMPLANT
INST SET LAPROSCOPIC AP (KITS) ×2 IMPLANT
KIT TURNOVER KIT A (KITS) ×2 IMPLANT
MANIFOLD NEPTUNE II (INSTRUMENTS) ×2 IMPLANT
NDL HYPO 18GX1.5 BLUNT FILL (NEEDLE) ×1 IMPLANT
NDL HYPO 21X1.5 SAFETY (NEEDLE) ×1 IMPLANT
NDL INSUFFLATION 14GA 120MM (NEEDLE) ×1 IMPLANT
NEEDLE HYPO 18GX1.5 BLUNT FILL (NEEDLE) ×2 IMPLANT
NEEDLE HYPO 21X1.5 SAFETY (NEEDLE) ×2 IMPLANT
NEEDLE INSUFFLATION 14GA 120MM (NEEDLE) ×2 IMPLANT
NS IRRIG 1000ML POUR BTL (IV SOLUTION) ×2 IMPLANT
PACK LAP CHOLE LZT030E (CUSTOM PROCEDURE TRAY) ×2 IMPLANT
PAD ARMBOARD 7.5X6 YLW CONV (MISCELLANEOUS) ×2 IMPLANT
RELOAD 45 VASCULAR/THIN (ENDOMECHANICALS) ×2 IMPLANT
RELOAD STAPLE 45 2.5 WHT GRN (ENDOMECHANICALS) IMPLANT
SET BASIN LINEN APH (SET/KITS/TRAYS/PACK) ×2 IMPLANT
SET TUBE IRRIG SUCTION NO TIP (IRRIGATION / IRRIGATOR) ×1 IMPLANT
SET TUBE SMOKE EVAC HIGH FLOW (TUBING) ×2 IMPLANT
SHEARS HARMONIC ACE PLUS 36CM (ENDOMECHANICALS) ×2 IMPLANT
SUT MNCRL AB 4-0 PS2 18 (SUTURE) ×4 IMPLANT
SUT VICRYL 0 UR6 27IN ABS (SUTURE) ×2 IMPLANT
SYR 20ML LL LF (SYRINGE) ×4 IMPLANT
SYS BAG RETRIEVAL 10MM (BASKET) ×1
SYSTEM BAG RETRIEVAL 10MM (BASKET) ×1 IMPLANT
TRAY FOLEY W/BAG SLVR 16FR (SET/KITS/TRAYS/PACK) ×2
TRAY FOLEY W/BAG SLVR 16FR ST (SET/KITS/TRAYS/PACK) ×1 IMPLANT
TROCAR ENDO BLADELESS 11MM (ENDOMECHANICALS) ×2 IMPLANT
TROCAR ENDO BLADELESS 12MM (ENDOMECHANICALS) ×2 IMPLANT
TROCAR XCEL NON-BLD 5MMX100MML (ENDOMECHANICALS) ×2 IMPLANT
TUBE CONNECTING 12X1/4 (SUCTIONS) ×2 IMPLANT
WARMER LAPAROSCOPE (MISCELLANEOUS) ×2 IMPLANT

## 2021-07-06 NOTE — Op Note (Signed)
[facility]  Date of Surgery: 07/05/2021 - 07/06/2021  Admit Date: 07/05/2021   Performing Service: General  Surgeon(s) and Role:    Lucretia Roers, MD - Primary   Pre-operative Diagnosis: Acute Appendicitis  Post-operative Diagnosis: Acute Appendicitis  Procedure Performed: Laparoscopic Appendectomy   Surgeon: Leatrice Jewels. Henreitta Leber, MD   Assistant: No qualified resident was available.   Anesthesia: General   Findings:  The appendix was found to be inflamed. There were not signs of necrosis. There was not perforation. There was not abscess formation.   Estimated Blood Loss: Minimal   Specimens: Appendix   Complications: None; patient tolerated the procedure well.   Disposition: PACU - hemodynamically stable.   Condition: stable   Indications: The patient presented with a 1 day history of right-sided abdominal pain. A CT revealed findings consistent with acute appendicitis.   Procedure Details  Prior to the procedure, the risks, benefits, complications, treatment options, and expected outcomes were discussed with the patient and/or family, including but not limited to the risk of bleeding, infection, finding of a normal appendix, and the need for conversion to an open procedure. There was concurrence with the proposed plan and informed consent was obtained. The patient was taken to the operating room, identified as Paulo Fruit and the procedure verified as Laproscopic Appendectomy.    The patient was placed in the supine position and general anesthesia was induced, along with placement of orogastric tube, SCD's, and a Foley catheter. The abdomen was prepped and draped in a sterile fashion. The abdomen was entered with Veress technique in the infraumbilical incision. Intraperitoneal placement was confirmed with saline drop, low entry pressures, and easy insufflation. A 11 mm optiview trocar was placed under direct visualization with a 0 degree scope. The 10  mm 0 degree scope was placed in the abdomen and no evidence of injury was identified. A 12 mm port was placed in the left lower quadrant of the abdomen after skin incision with trocar placement under direct vision. A careful evaluation of the entire abdomen was carried out. An additional 5 mm port was placed in the suprapubic area under direct vision.  The patient was placed in Trendelenburg and left lateral decubitus position. The small intestines were retracted in the cephalad and left lateral direction away from the pelvis and right lower quadrant. The patient was found to have an dilated inflamed appendix. There was not evidence of perforation.   The appendix was carefully dissected. A window was made in the mesoappendix at the base of the appendix. The appendix was divided at its base using a standard endo-GIA stapler. Minimal appendiceal stump was left in place. The mesoappendix was taken with the harmonic energy device. The appendix was placed within an Endocatch specimen bag. There was no evidence of bleeding, leakage, or complication after division of the appendix.  Any remaining blood or pus was suctioned out from the abdomen, hemostasis was confirmed. The endocatch bag was removed via the 12 mm port, then the abdomen desufflated. The appendix was passed off the field as a specimen.   The the 12 mm and 10 mm port sites were closed with a 0 Vicryl suture. The trocar site skin wounds were closed using subcuticular 4-0 Monocryl suture and dermabond. The patient was then awakened from general anesthesia, extubated, and taken to PACU for recovery.   Instrument, sponge, and needle counts were correct at the conclusion of the case.   Algis Greenhouse, MD [facility] [address]  [address] [place]ella [place], [place] [address] [phone](YOMAYO)

## 2021-07-06 NOTE — Progress Notes (Signed)
Nsg Discharge Note  Admit Date:  07/05/2021 Discharge date: 07/06/2021   Gail Lee to be D/C'd Home per MD order.  AVS completed.  Copy for chart, and copy for patient signed, and dated. Patient/caregiver able to verbalize understanding.  Discharge Medication: Allergies as of 07/06/2021   No Known Allergies      Medication List     TAKE these medications    levothyroxine 50 MCG tablet Commonly known as: SYNTHROID Take 50 mcg by mouth daily before breakfast.   ondansetron 4 MG tablet Commonly known as: ZOFRAN Take 1 tablet (4 mg total) by mouth every 6 (six) hours as needed for nausea.   oxyCODONE 5 MG immediate release tablet Commonly known as: Oxy IR/ROXICODONE Take 1 tablet (5 mg total) by mouth every 4 (four) hours as needed for severe pain or breakthrough pain.        Discharge Assessment: Vitals:   07/06/21 1100 07/06/21 1422  BP: 115/82 93/71  Pulse: 78 81  Resp: 18 18  Temp: 98.7 F (37.1 C) 98.5 F (36.9 C)  SpO2: 99% 97%   Skin clean, dry and intact without evidence of skin break down, no evidence of skin tears noted. IV catheter discontinued intact. Site without signs and symptoms of complications - no redness or edema noted at insertion site, patient denies c/o pain - only slight tenderness at site.  Dressing with slight pressure applied.  D/c Instructions-Education: Discharge instructions given to patient/family with verbalized understanding. D/c education completed with patient/family including follow up instructions, medication list, d/c activities limitations if indicated, with other d/c instructions as indicated by MD - patient able to verbalize understanding, all questions fully answered. Patient instructed to return to ED, call 911, or call MD for any changes in condition.  Patient escorted via WC, and D/C home via private auto.  Brandy Hale, RN 07/06/2021 6:11 PM Nsg Discharge Note  Admit Date:  07/05/2021 Discharge date:  07/06/2021   Gail Lee to be D/C'd Home per MD order.  AVS completed.  Copy for chart, and copy for patient signed, and dated. Patient/caregiver able to verbalize understanding.  Discharge Medication: Allergies as of 07/06/2021   No Known Allergies      Medication List     TAKE these medications    levothyroxine 50 MCG tablet Commonly known as: SYNTHROID Take 50 mcg by mouth daily before breakfast.   ondansetron 4 MG tablet Commonly known as: ZOFRAN Take 1 tablet (4 mg total) by mouth every 6 (six) hours as needed for nausea.   oxyCODONE 5 MG immediate release tablet Commonly known as: Oxy IR/ROXICODONE Take 1 tablet (5 mg total) by mouth every 4 (four) hours as needed for severe pain or breakthrough pain.        Discharge Assessment: Vitals:   07/06/21 1100 07/06/21 1422  BP: 115/82 93/71  Pulse: 78 81  Resp: 18 18  Temp: 98.7 F (37.1 C) 98.5 F (36.9 C)  SpO2: 99% 97%   Skin clean, dry and intact without evidence of skin break down, no evidence of skin tears noted. IV catheter discontinued intact. Site without signs and symptoms of complications - no redness or edema noted at insertion site, patient denies c/o pain - only slight tenderness at site.  Dressing with slight pressure applied.  D/c Instructions-Education: Discharge instructions given to patient/family with verbalized understanding. D/c education completed with patient/family including follow up instructions, medication list, d/c activities limitations if indicated, with other d/c instructions as indicated  by MD - patient able to verbalize understanding, all questions fully answered. Patient instructed to return to ED, call 911, or call MD for any changes in condition.  Patient escorted via WC, and D/C home via private auto.  Brandy Hale, LPN 0/93/8182 9:93 PM

## 2021-07-06 NOTE — Transfer of Care (Signed)
Immediate Anesthesia Transfer of Care Note  Patient: Gail Lee  Procedure(s) Performed: APPENDECTOMY LAPAROSCOPIC  Patient Location: PACU  Anesthesia Type:General  Level of Consciousness: awake, alert , oriented and sedated  Airway & Oxygen Therapy: Patient Spontanous Breathing and Patient connected to nasal cannula oxygen  Post-op Assessment: Report given to RN and Post -op Vital signs reviewed and stable  Post vital signs: Reviewed and stable  Last Vitals:  Vitals Value Taken Time  BP 111/81 07/06/21 1009  Temp 98.7   Pulse 98 07/06/21 1011  Resp 22 07/06/21 1011  SpO2 94 % 07/06/21 1011  Vitals shown include unvalidated device data.  Last Pain:  Vitals:   07/06/21 0618  TempSrc:   PainSc: 2          Complications: No notable events documented.

## 2021-07-06 NOTE — Anesthesia Procedure Notes (Signed)
Procedure Name: Intubation Date/Time: 07/06/2021 9:11 AM Performed by: Denese Killings, MD Pre-anesthesia Checklist: Patient identified, Emergency Drugs available, Suction available and Patient being monitored Patient Re-evaluated:Patient Re-evaluated prior to induction Oxygen Delivery Method: Circle system utilized Preoxygenation: Pre-oxygenation with 100% oxygen Induction Type: IV induction Ventilation: Mask ventilation without difficulty Laryngoscope Size: Mac and 3 Grade View: Grade I Tube type: Oral Tube size: 7.0 mm Number of attempts: 1 Airway Equipment and Method: Stylet Placement Confirmation: ETT inserted through vocal cords under direct vision, positive ETCO2 and breath sounds checked- equal and bilateral Secured at: 21 cm Tube secured with: Tape Dental Injury: Teeth and Oropharynx as per pre-operative assessment

## 2021-07-06 NOTE — Progress Notes (Signed)
VS rechecked after pt at rest and after pain medication administered.  MEWS now green. Hilton Sinclair BSN RN CMSRN   07/06/21 0526 07/06/21 0533 07/06/21 0618  Assess: MEWS Score  Temp 98.8 F (37.1 C)  --   --   BP 110/76  --  100/68  Pulse Rate (!) 111  --  96  Resp  --   --  18  SpO2 97 %  --  97 %  O2 Device  --   --  Room Air  Assess: MEWS Score  MEWS Temp 0  --  0  MEWS Systolic 0  --  1  MEWS Pulse 2  --  0  MEWS RR 0  --  0  MEWS LOC 0  --  0  MEWS Score 2  --  1  MEWS Score Color Yellow  --  Green  Assess: if the MEWS score is Yellow or Red  Were vital signs taken at a resting state? No  --   --   Focused Assessment No change from prior assessment  --   --   Early Detection of Sepsis Score *See Row Information* Low  --   --   MEWS guidelines implemented *See Row Information* No, vital signs rechecked  --   --   Treat  MEWS Interventions Administered scheduled meds/treatments  --   --   Pain Scale  --  0-10 0-10  Pain Score  --  5 2  Pain Location  --  Abdomen  --   Pain Orientation  --  Lower;Mid  --   Pain Intervention(s)  --  Medication (See eMAR);Emotional support  --

## 2021-07-06 NOTE — Discharge Summary (Signed)
Physician Discharge Summary  Patient ID: Gail Lee MRN: [id number] DOB/AGE: 07/04/1996 25 y.o.  Admit date: 07/05/2021 Discharge date: 07/06/2021  Admission Diagnoses: Acute appendicitis   Discharge Diagnoses:  Principal Problem:   Appendicitis   Discharged Condition: good  Hospital Course: Ms. Colclasure is a 25 yo who was brought in overnight and had Iv antibiotics. She went for a laparoscopic appendectomy on 9/18 and was discharged later that day. Prior to discharge she was having pain control and was tolerating some diet.   Consults:  hospitalist admission, transferred to surgery   Significant Diagnostic Studies: CT with appendicitis   Treatments: IV antibiotics, laparoscopic appendectomy 07/06/2021  Discharge Exam: Blood pressure 93/71, pulse 81, temperature 98.5 F (36.9 C), temperature source Oral, resp. rate 18, height 5\' 1"  (1.549 m), weight 71.1 kg, last menstrual period 06/20/2021, SpO2 97 %.  Disposition: Discharge disposition: 01-Home or Self Care      Discharge Instructions     Call MD for:  difficulty breathing, headache or visual disturbances   Complete by: As directed    Call MD for:  extreme fatigue   Complete by: As directed    Call MD for:  persistant dizziness or light-headedness   Complete by: As directed    Call MD for:  persistant nausea and vomiting   Complete by: As directed    Call MD for:  redness, tenderness, or signs of infection (pain, swelling, redness, odor or green/yellow discharge around incision site)   Complete by: As directed    Call MD for:  severe uncontrolled pain   Complete by: As directed    Call MD for:  temperature >100.4   Complete by: As directed    Increase activity slowly   Complete by: As directed       Allergies as of 07/06/2021   No Known Allergies      Medication List     TAKE these medications    levothyroxine 50 MCG tablet Commonly known as: SYNTHROID Take 50 mcg by mouth daily before breakfast.    ondansetron 4 MG tablet Commonly known as: ZOFRAN Take 1 tablet (4 mg total) by mouth every 6 (six) hours as needed for nausea.   oxyCODONE 5 MG immediate release tablet Commonly known as: Oxy IR/ROXICODONE Take 1 tablet (5 mg total) by mouth every 4 (four) hours as needed for severe pain or breakthrough pain.        Follow-up Information     07/08/2021, MD Follow up on 07/22/2021.   Specialty: General Surgery Why: post op phone call, if you need to be seen in person call the office Contact information: [address] [facility] [phone]                 Signed: [phone] 07/08/2021, 5:16 PM

## 2021-07-06 NOTE — Progress Notes (Signed)
Patient's chart reviewed. Admitted after midnight with acute appendicitis; After discussing with general surgery patient will go to OR today for appendectomy and will be transfer to general surgery service. IM will remain available for any needs. Patient hemodynamically stable currently. Please refer to H7P written by Dr. Carren Rang for further info/details on admission.  Vassie Loll MD [phone]

## 2021-07-06 NOTE — Discharge Instructions (Signed)
Discharge Laparoscopic Surgery Instructions:  Common Complaints: Right shoulder pain is common after laparoscopic surgery. This is secondary to the gas used in the surgery being trapped under the diaphragm.  Walk to help your body absorb the gas. This will improve in a few days. Pain at the port sites are common, especially the larger port sites. This will improve with time.  Some nausea is common and poor appetite. The main goal is to stay hydrated the first few days after surgery.   Diet/ Activity: Diet as tolerated. You may not have an appetite, but it is important to stay hydrated. Drink 64 ounces of water a day. Your appetite will return with time.  Shower per your regular routine daily.  Do not take hot showers. Take warm showers that are less than 10 minutes. Rest and listen to your body, but do not remain in bed all day.  Walk everyday for at least 15-20 minutes. Deep cough and move around every 1-2 hours in the first few days after surgery.  Do not lift > 10 lbs, perform excessive bending, pushing, pulling, squatting for 1-2 weeks after surgery.  Do not pick at the dermabond glue on your incision sites.  This glue film will remain in place for 1-2 weeks and will start to peel off.  Do not place lotions or balms on your incision unless instructed to specifically by Dr. Ziquan Fidel.   Pain Expectations and Narcotics: -After surgery you will have pain associated with your incisions and this is normal. The pain is muscular and nerve pain, and will get better with time. -You are encouraged and expected to take non narcotic medications like tylenol and ibuprofen (when able) to treat pain as multiple modalities can aid with pain treatment. -Narcotics are only used when pain is severe or there is breakthrough pain. -You are not expected to have a pain score of 0 after surgery, as we cannot prevent pain. A pain score of 3-4 that allows you to be functional, move, walk, and tolerate some activity is  the goal. The pain will continue to improve over the days after surgery and is dependent on your surgery. -Due to [place] law, we are only able to give a certain amount of pain medication to treat post operative pain, and we only give additional narcotics on a patient by patient basis.  -For most laparoscopic surgery, studies have shown that the majority of patients only need 10-15 narcotic pills, and for open surgeries most patients only need 15-20.   -Having appropriate expectations of pain and knowledge of pain management with non narcotics is important as we do not want anyone to become addicted to narcotic pain medication.  -Using ice packs in the first 48 hours and heating pads after 48 hours, wearing an abdominal binder (when recommended), and using over the counter medications are all ways to help with pain management.   -Simple acts like meditation and mindfulness practices after surgery can also help with pain control and research has proven the benefit of these practices.  Medication: Take tylenol and ibuprofen as needed for pain control, alternating every 4-6 hours.  Example:  Tylenol 1000mg @ 6am, 12noon, 6pm, 12midnight (Do not exceed 4000mg of tylenol a day). Ibuprofen 800mg @ 9am, 3pm, 9pm, 3am (Do not exceed 3600mg of ibuprofen a day).  Take Roxicodone for breakthrough pain every 4 hours.  Take Colace for constipation related to narcotic pain medication. If you do not have a bowel movement in 2 days, take Miralax   over the counter.  Drink plenty of water to also prevent constipation.   Contact Information: If you have questions or concerns, please call our office, [phone], Monday- Thursday 8AM-5PM and Friday 8AM-12Noon.  If it is after hours or on the weekend, please call [facility], [phone], [phone], and ask to speak to the surgeon on call for Dr. Floraine Buechler at [place].   

## 2021-07-06 NOTE — Consult Note (Signed)
[facility] Consult  Reason for Consult: Acute appendicitis  Referring Physician:  ED   Chief Complaint   Abdominal Pain     HPI: Gail Lee is a 25 y.o. female with acute appendicitis on CT scan that started with pain and nausea yesterday. The pain continued to get worse and her nausea continued. She came to the ED and had a [place] to ensure nothing going on with her PCOS. [place] was negative and Ct was done with concern for early appendicitis with a dilated appendix. Antibiotics were started and she was admitted overnight.   Past Medical History:  Diagnosis Date   Headache(784.0)    PCOS (polycystic ovarian syndrome)    Thyroid disease     History reviewed. No pertinent surgical history.  History reviewed. No pertinent family history.  Social History   Tobacco Use   Smoking status: Never   Smokeless tobacco: Never  Vaping Use   Vaping Use: Never used  Substance Use Topics   Alcohol use: No   Drug use: No    Medications: I have reviewed the patient's current medications. Prior to Admission:  Medications Prior to Admission  Medication Sig Dispense Refill Last Dose   levothyroxine (SYNTHROID) 50 MCG tablet Take 50 mcg by mouth daily before breakfast.   07/04/2021   Scheduled:  heparin  5,000 Units Subcutaneous Q8H   levothyroxine  50 mcg Oral Q0600   Continuous:  sodium chloride 100 mL/hr at 07/05/21 2242   cefoTEtan (CEFOTAN) IV     piperacillin-tazobactam 12.5 mL/hr at 07/06/21 0600   YPP:[id number] **OR** acetaminophen, [START ON 07/07/2021] influenza vac split quadrivalent PF, morphine injection, ondansetron **OR** ondansetron (ZOFRAN) IV, oxyCODONE  No Known Allergies   ROS:  A comprehensive review of systems was negative except for: Gastrointestinal: positive for abdominal pain and nausea  Blood pressure 100/68, pulse 96, temperature 98.8 F (37.1 C), temperature source Oral, resp. rate 18, height 5\' 1"  (1.549 m), weight 71.1 kg, last  menstrual period 06/20/2021, SpO2 97 %. Physical Exam Vitals reviewed.  Constitutional:      Appearance: She is normal weight.  HENT:     Head: Normocephalic.  Cardiovascular:     Rate and Rhythm: Normal rate and regular rhythm.  Pulmonary:     Effort: Pulmonary effort is normal.     Breath sounds: Normal breath sounds.  Abdominal:     General: There is no distension.     Palpations: Abdomen is soft.     Tenderness: There is abdominal tenderness in the right lower quadrant and suprapubic area.  Musculoskeletal:     Comments: Moves all extremities   Skin:    General: Skin is warm.  Neurological:     General: No focal deficit present.     Mental Status: She is alert and oriented to person, place, and time.  Psychiatric:        Mood and Affect: Mood normal.        Behavior: Behavior normal.    Results: Results for orders placed or performed during the hospital encounter of 07/05/21 (from the past 48 hour(s))  Lipase, blood     Status: None   Collection Time: 07/05/21  5:47 PM  Result Value Ref Range   Lipase 23 11 - 51 U/L    Comment: Performed at [facility], [address] [place], [place]  Comprehensive metabolic panel     Status: Abnormal   Collection Time: 07/05/21  5:47 PM  Result Value Ref Range  Sodium 134 (L) 135 - 145 mmol/L   Potassium 3.4 (L) 3.5 - 5.1 mmol/L   Chloride 102 98 - 111 mmol/L   CO2 23 22 - 32 mmol/L   Glucose, Bld 110 (H) 70 - 99 mg/dL    Comment: Glucose reference range applies only to samples taken after fasting for at least 8 hours.   BUN 10 6 - 20 mg/dL   Creatinine, Ser 7.51 0.44 - 1.00 mg/dL   Calcium 9.0 8.9 - 02.5 mg/dL   Total Protein 7.9 6.5 - 8.1 g/dL   Albumin 4.9 3.5 - 5.0 g/dL   AST 26 15 - 41 U/L   ALT 42 0 - 44 U/L   Alkaline Phosphatase 87 38 - 126 U/L   Total Bilirubin 0.6 0.3 - 1.2 mg/dL   GFR, Estimated >85 >27 mL/min    Comment: (NOTE) Calculated using the CKD-EPI Creatinine Equation (2021)    Anion gap 9  5 - 15    Comment: Performed at [facility], [address], [place], [place] [address]  CBC     Status: Abnormal   Collection Time: 07/05/21  5:47 PM  Result Value Ref Range   WBC 16.6 (H) 4.0 - 10.5 K/uL   RBC 4.83 3.87 - 5.11 MIL/uL   Hemoglobin 15.8 (H) 12.0 - 15.0 g/dL   HCT 35.3 (H) 61.4 - 43.1 %   MCV 99.2 80.0 - 100.0 fL   MCH 32.7 26.0 - 34.0 pg   MCHC 33.0 30.0 - 36.0 g/dL   RDW 54.0 08.6 - 76.1 %   Platelets 286 150 - 400 K/uL   nRBC 0.0 0.0 - 0.2 %    Comment: Performed at [facility], [address] [place], [place] [address]  POC urine preg, ED     Status: None   Collection Time: 07/05/21  8:01 PM  Result Value Ref Range   Preg Test, Ur NEGATIVE NEGATIVE    Comment:        THE SENSITIVITY OF THIS METHODOLOGY IS >24 mIU/mL   Resp Panel by RT-PCR (Flu A&B, Covid) Nasopharyngeal Swab     Status: None   Collection Time: 07/05/21 11:20 PM   Specimen: Nasopharyngeal Swab; Nasopharyngeal(NP) swabs in vial transport medium  Result Value Ref Range   SARS Coronavirus 2 by RT PCR NEGATIVE NEGATIVE    Comment: (NOTE) SARS-CoV-2 target nucleic acids are NOT DETECTED.  The SARS-CoV-2 RNA is generally detectable in upper respiratory specimens during the acute phase of infection. The lowest concentration of SARS-CoV-2 viral copies this assay can detect is 138 copies/mL. A negative result does not preclude SARS-Cov-2 infection and should not be used as the sole basis for treatment or other patient management decisions. A negative result may occur with  improper specimen collection/handling, submission of specimen other than nasopharyngeal swab, presence of viral mutation(s) within the areas targeted by this assay, and inadequate number of viral copies(<138 copies/mL). A negative result must be combined with clinical observations, patient history, and epidemiological information. The expected result is Negative.  Fact Sheet for Patients:   BloggerCourse.com  Fact Sheet for Healthcare Providers:  SeriousBroker.it  This test is no t yet approved or cleared by the [place] FDA and  has been authorized for detection and/or diagnosis of SARS-CoV-2 by FDA under an Emergency Use Authorization (EUA). This EUA will remain  in effect (meaning this test can be used) for the duration of the COVID-19 declaration under Section 564(b)(1) of the Act, 21 U.S.C.section 360bbb-3(b)(1),  unless the authorization is terminated  or revoked sooner.       Influenza A by PCR NEGATIVE NEGATIVE   Influenza B by PCR NEGATIVE NEGATIVE    Comment: (NOTE) The Xpert Xpress SARS-CoV-2/FLU/RSV plus assay is intended as an aid in the diagnosis of influenza from Nasopharyngeal swab specimens and should not be used as a sole basis for treatment. Nasal washings and aspirates are unacceptable for Xpert Xpress SARS-CoV-2/FLU/RSV testing.  Fact Sheet for Patients: BloggerCourse.com  Fact Sheet for Healthcare Providers: SeriousBroker.it  This test is not yet approved or cleared by the [place] FDA and has been authorized for detection and/or diagnosis of SARS-CoV-2 by FDA under an Emergency Use Authorization (EUA). This EUA will remain in effect (meaning this test can be used) for the duration of the COVID-19 declaration under Section 564(b)(1) of the Act, 21 U.S.C. section 360bbb-3(b)(1), unless the authorization is terminated or revoked.  Performed at [facility], [address] [place], [place] [address]   Surgical PCR screen     Status: None   Collection Time: 07/06/21  1:44 AM   Specimen: Nasal Mucosa; Nasal Swab  Result Value Ref Range   MRSA, PCR NEGATIVE NEGATIVE   Staphylococcus aureus NEGATIVE NEGATIVE    Comment: (NOTE) The Xpert SA Assay (FDA approved for NASAL specimens in patients 67 years of age and older), is one  component of a comprehensive surveillance program. It is not intended to diagnose infection nor to guide or monitor treatment. Performed at [facility], [address] [place], [place] [address]   Comprehensive metabolic panel     Status: Abnormal   Collection Time: 07/06/21  5:12 AM  Result Value Ref Range   Sodium 137 135 - 145 mmol/L   Potassium 3.4 (L) 3.5 - 5.1 mmol/L   Chloride 106 98 - 111 mmol/L   CO2 27 22 - 32 mmol/L   Glucose, Bld 97 70 - 99 mg/dL    Comment: Glucose reference range applies only to samples taken after fasting for at least 8 hours.   BUN 10 6 - 20 mg/dL   Creatinine, Ser 6.43 0.44 - 1.00 mg/dL   Calcium 8.1 (L) 8.9 - 10.3 mg/dL   Total Protein 5.9 (L) 6.5 - 8.1 g/dL   Albumin 3.5 3.5 - 5.0 g/dL   AST 17 15 - 41 U/L   ALT 28 0 - 44 U/L   Alkaline Phosphatase 69 38 - 126 U/L   Total Bilirubin 0.7 0.3 - 1.2 mg/dL   GFR, Estimated >83 >81 mL/min    Comment: (NOTE) Calculated using the CKD-EPI Creatinine Equation (2021)    Anion gap 4 (L) 5 - 15    Comment: Performed at [facility], [address], [place], [place] [address]  Magnesium     Status: Abnormal   Collection Time: 07/06/21  5:12 AM  Result Value Ref Range   Magnesium 1.6 (L) 1.7 - 2.4 mg/dL    Comment: Performed at [facility], [address], [place], [place] [address]  CBC WITH DIFFERENTIAL     Status: Abnormal   Collection Time: 07/06/21  5:12 AM  Result Value Ref Range   WBC 14.3 (H) 4.0 - 10.5 K/uL   RBC 3.90 3.87 - 5.11 MIL/uL   Hemoglobin 12.9 12.0 - 15.0 g/dL   HCT 67.7 03.4 - 03.5 %   MCV 99.0 80.0 - 100.0 fL   MCH 33.1 26.0 - 34.0 pg   MCHC 33.4 30.0 - 36.0 g/dL  RDW 11.8 11.5 - 15.5 %   Platelets 255 150 - 400 K/uL   nRBC 0.0 0.0 - 0.2 %   Neutrophils Relative % 78 %   Neutro Abs 11.1 (H) 1.7 - 7.7 K/uL   Lymphocytes Relative 16 %   Lymphs Abs 2.2 0.7 - 4.0 K/uL   Monocytes Relative 6 %   Monocytes Absolute 0.9 0.1 - 1.0 K/uL   Eosinophils Relative 0 %   Eosinophils  Absolute 0.0 0.0 - 0.5 K/uL   Basophils Relative 0 %   Basophils Absolute 0.0 0.0 - 0.1 K/uL   Immature Granulocytes 0 %   Abs Immature Granulocytes 0.06 0.00 - 0.07 K/uL    Comment: Performed at [facility], [address], [place], [place] [address]   Personally reviewed- dilated appendix concerning for appendicitis  US Transvaginal Non-OB  Result Date: 07/05/2021 CLINICAL DATA:  Rule out torsion.  Pelvic pain for 1 day. EXAM: TRANSABDOMINAL AND TRANSVAGINAL ULTRASOUND OF PELVIS DOPPLER ULTRASOUND OF OVARIES TECHNIQUE: Both transabdominal and transvaginal ultrasound examinations of the pelvis were performed. Transabdominal technique was performed for global imaging of the pelvis including uterus, ovaries, adnexal regions, and pelvic cul-de-sac. It was necessary to proceed with endovaginal exam following the transabdominal exam to visualize the ovaries and endometrium. Color and duplex Doppler ultrasound was utilized to evaluate blood flow to the ovaries. COMPARISON:  05/30/2013 FINDINGS: Uterus Measurements: 7.3 x 3.3 x 4 cm = volume: 50 mL. Anteverted uterus. No myometrial mass or abnormality identified. Endometrium Thickness: 11.5 mm.  No focal abnormality visualized. Right ovary Measurements: 4.4 x 3.1 x 4.2 cm = volume: 30 mL. Right ovary is prominent in size with multiple follicles arranged peripherally consistent with history of polycystic ovary disease. No dominant mass identified. Flow is demonstrated in the right ovary on color flow Doppler imaging. Left ovary Measurements: 5.2 x 2.8 x 3.7 cm = volume: 28 mL. Left ovary is prominent in size with multiple follicles arranged peripherally consistent with history of polycystic ovarian disease. No dominant mass identified. Flow is demonstrated in the left ovary on color flow Doppler imaging. Pulsed Doppler evaluation of both ovaries demonstrates normal low-resistance arterial and venous waveforms. Other findings Small amount of free fluid in the  cul-de-sac and around the ovaries. This is nonspecific but likely physiologic. IMPRESSION: 1. Appearance and configuration of the ovaries is consistent with history of polycystic ovarian disease. No evidence of abnormal mass or torsion. 2. Small amount of free fluid is likely physiologic. 3. Normal ultrasound appearance of the uterus. Electronically Signed   By: Burman Nieves M.D.   On: 07/05/2021 21:39   US Pelvis Complete  Result Date: 07/05/2021 CLINICAL DATA:  Rule out torsion.  Pelvic pain for 1 day. EXAM: TRANSABDOMINAL AND TRANSVAGINAL ULTRASOUND OF PELVIS DOPPLER ULTRASOUND OF OVARIES TECHNIQUE: Both transabdominal and transvaginal ultrasound examinations of the pelvis were performed. Transabdominal technique was performed for global imaging of the pelvis including uterus, ovaries, adnexal regions, and pelvic cul-de-sac. It was necessary to proceed with endovaginal exam following the transabdominal exam to visualize the ovaries and endometrium. Color and duplex Doppler ultrasound was utilized to evaluate blood flow to the ovaries. COMPARISON:  05/30/2013 FINDINGS: Uterus Measurements: 7.3 x 3.3 x 4 cm = volume: 50 mL. Anteverted uterus. No myometrial mass or abnormality identified. Endometrium Thickness: 11.5 mm.  No focal abnormality visualized. Right ovary Measurements: 4.4 x 3.1 x 4.2 cm = volume: 30 mL. Right ovary is prominent in size with multiple follicles arranged peripherally consistent with  history of polycystic ovary disease. No dominant mass identified. Flow is demonstrated in the right ovary on color flow Doppler imaging. Left ovary Measurements: 5.2 x 2.8 x 3.7 cm = volume: 28 mL. Left ovary is prominent in size with multiple follicles arranged peripherally consistent with history of polycystic ovarian disease. No dominant mass identified. Flow is demonstrated in the left ovary on color flow Doppler imaging. Pulsed Doppler evaluation of both ovaries demonstrates normal low-resistance  arterial and venous waveforms. Other findings Small amount of free fluid in the cul-de-sac and around the ovaries. This is nonspecific but likely physiologic. IMPRESSION: 1. Appearance and configuration of the ovaries is consistent with history of polycystic ovarian disease. No evidence of abnormal mass or torsion. 2. Small amount of free fluid is likely physiologic. 3. Normal ultrasound appearance of the uterus. Electronically Signed   By: Burman Nieves M.D.   On: 07/05/2021 21:39   CT ABDOMEN PELVIS W CONTRAST  Result Date: 07/05/2021 CLINICAL DATA:  Right lower quadrant abdominal pain since this morning. Nausea. EXAM: CT ABDOMEN AND PELVIS WITH CONTRAST TECHNIQUE: Multidetector CT imaging of the abdomen and pelvis was performed using the standard protocol following bolus administration of intravenous contrast. CONTRAST:  80mL OMNIPAQUE IOHEXOL 350 MG/ML SOLN COMPARISON:  11/19/2010 FINDINGS: Lower chest: Lung bases are clear. Hepatobiliary: No focal liver abnormality is seen. No gallstones, gallbladder wall thickening, or biliary dilatation. Pancreas: Unremarkable. No pancreatic ductal dilatation or surrounding inflammatory changes. Spleen: Normal in size without focal abnormality. Adrenals/Urinary Tract: Adrenal glands are unremarkable. Kidneys are normal, without renal calculi, focal lesion, or hydronephrosis. Bladder is unremarkable. Stomach/Bowel: Stomach, small bowel, and colon are not abnormally distended. No wall thickening or inflammatory changes are appreciated. The appendix is distended and fluid-filled with appendiceal diameter measuring about 1.3 cm. Minimal if any inflammatory stranding around the appendix. Minimal free fluid around the appendix. No loculated collections. Changes are likely to indicate early acute appendicitis although an appendiceal mucocele could possibly also have this appearance. Appendix: Location: Medial to the cecum extending towards the midline with tip anterior to the  iliopsoas muscle. Diameter: 1.3 cm Appendicolith: No Mucosal hyper-enhancement: No Extraluminal gas: No Periappendiceal collection: No Vascular/Lymphatic: No significant vascular findings are present. No enlarged abdominal or pelvic lymph nodes. Reproductive: Uterus is not enlarged. Prominent appearance of the ovaries, similar to prior study, consistent with history of polycystic ovarian disease. Other: Small amount of free fluid in the pelvis may be physiologic or reactive. Musculoskeletal: No acute or significant osseous findings. IMPRESSION: 1. Distended fluid-filled appendix at 1.3 cm diameter, likely appendicitis. No abscess. 2. Prominence of the ovaries consistent with history of polycystic ovarian disease. Electronically Signed   By: Burman Nieves M.D.   On: 07/05/2021 21:43   [place] Art/Ven Flow Abd Pelv Doppler  Result Date: 07/05/2021 CLINICAL DATA:  Rule out torsion.  Pelvic pain for 1 day. EXAM: TRANSABDOMINAL AND TRANSVAGINAL ULTRASOUND OF PELVIS DOPPLER ULTRASOUND OF OVARIES TECHNIQUE: Both transabdominal and transvaginal ultrasound examinations of the pelvis were performed. Transabdominal technique was performed for global imaging of the pelvis including uterus, ovaries, adnexal regions, and pelvic cul-de-sac. It was necessary to proceed with endovaginal exam following the transabdominal exam to visualize the ovaries and endometrium. Color and duplex Doppler ultrasound was utilized to evaluate blood flow to the ovaries. COMPARISON:  05/30/2013 FINDINGS: Uterus Measurements: 7.3 x 3.3 x 4 cm = volume: 50 mL. Anteverted uterus. No myometrial mass or abnormality identified. Endometrium Thickness: 11.5 mm.  No focal abnormality visualized. Right ovary  Measurements: 4.4 x 3.1 x 4.2 cm = volume: 30 mL. Right ovary is prominent in size with multiple follicles arranged peripherally consistent with history of polycystic ovary disease. No dominant mass identified. Flow is demonstrated in the right ovary on  color flow Doppler imaging. Left ovary Measurements: 5.2 x 2.8 x 3.7 cm = volume: 28 mL. Left ovary is prominent in size with multiple follicles arranged peripherally consistent with history of polycystic ovarian disease. No dominant mass identified. Flow is demonstrated in the left ovary on color flow Doppler imaging. Pulsed Doppler evaluation of both ovaries demonstrates normal low-resistance arterial and venous waveforms. Other findings Small amount of free fluid in the cul-de-sac and around the ovaries. This is nonspecific but likely physiologic. IMPRESSION: 1. Appearance and configuration of the ovaries is consistent with history of polycystic ovarian disease. No evidence of abnormal mass or torsion. 2. Small amount of free fluid is likely physiologic. 3. Normal ultrasound appearance of the uterus. Electronically Signed   By: Burman Nieves M.D.   On: 07/05/2021 21:39     Assessment & Plan:  MANON BANBURY is a 25 y.o. female with acute appendicitis.  Discussed the risk of laparoscopic appendectomy and the option of antibiotics alone. Discussed that in [place] and some trials in the [place], antibiotics are used for simple appendicitis. Discussed that research shows a 40% failure rate for antibiotics alone.  Discussed risk of surgery including but not limited to bleeding, infection, injury to other organs, normal appendix, and after this discussion the patient has decided to proceed with surgery.   All questions were answered to the satisfaction of the patient and family.   Gail Lee 07/06/2021, 8:43 AM

## 2021-07-06 NOTE — Anesthesia Preprocedure Evaluation (Addendum)
Anesthesia Evaluation  Patient identified by MRN, date of birth, ID band Patient awake    Reviewed: Allergy & Precautions, NPO status , Patient's Chart, lab work & pertinent test results  History of Anesthesia Complications Negative for: history of anesthetic complications (never had anesthesia before, no family h/o anesthesia complications)  Airway Mallampati: II  TM Distance: >3 FB Neck ROM: Full    Dental  (+) Dental Advisory Given, Chipped,    Pulmonary neg pulmonary ROS,    Pulmonary exam normal breath sounds clear to auscultation       Cardiovascular Exercise Tolerance: Good  Rhythm:Regular Rate:Tachycardia  06-Jul-2021 05:39:32 [facility]-AP-300 ROUTINE RECORD 16-Jun-1996 (25 yr) Female Caucasian Vent. rate 112 BPM PR interval 152 ms QRS duration 70 ms QT/QTcB 332/453 ms P-R-T axes 38 21 34 Sinus tachycardia Otherwise normal ECG   Neuro/Psych  Headaches, negative psych ROS   GI/Hepatic negative GI ROS,   Endo/Other  Hypothyroidism   Renal/GU negative Renal ROS     Musculoskeletal negative musculoskeletal ROS (+)   Abdominal   Peds  Hematology negative hematology ROS (+)   Anesthesia Other Findings   Reproductive/Obstetrics negative OB ROS                            Anesthesia Physical Anesthesia Plan  ASA: 2 and emergent  Anesthesia Plan: General   Post-op Pain Management:    Induction: Intravenous  PONV Risk Score and Plan: 4 or greater and Ondansetron, Dexamethasone and Midazolam  Airway Management Planned: Oral ETT  Additional Equipment:   Intra-op Plan:   Post-operative Plan: Extubation in OR  Informed Consent: I have reviewed the patients History and Physical, chart, labs and discussed the procedure including the risks, benefits and alternatives for the proposed anesthesia with the patient or authorized representative who has indicated  his/her understanding and acceptance.     Dental advisory given  Plan Discussed with: Surgeon  Anesthesia Plan Comments:         Anesthesia Quick Evaluation

## 2021-07-06 NOTE — Progress Notes (Signed)
To whom it may concern,  Patient was in the hospital 9/17 and had surgery on 9/18.  She can return to work without restrictions on 9/26.  Algis Greenhouse, MD [facility] [phone]

## 2021-07-06 NOTE — Anesthesia Postprocedure Evaluation (Signed)
Anesthesia Post Note  Patient: Gail Lee  Procedure(s) Performed: APPENDECTOMY LAPAROSCOPIC  Patient location during evaluation: PACU Anesthesia Type: General Level of consciousness: awake and alert and oriented Pain management: pain level controlled Vital Signs Assessment: post-procedure vital signs reviewed and stable Respiratory status: spontaneous breathing and respiratory function stable Cardiovascular status: blood pressure returned to baseline and stable Postop Assessment: no apparent nausea or vomiting Anesthetic complications: no   No notable events documented.   Last Vitals:  Vitals:   07/06/21 1009 07/06/21 1015  BP: 111/81 113/82  Pulse: 99 84  Resp: 18 18  Temp: 37.1 C   SpO2: 94% 95%    Last Pain:  Vitals:   07/06/21 1009  TempSrc:   PainSc: 2                  Latisa Belay C Damarian Priola

## 2021-07-06 NOTE — H&P (Signed)
TRH H&P    Patient Demographics:    Gail Lee, is a 25 y.o. female  MRN: [id number]  DOB - 06-06-1996  Admit Date - 07/05/2021  Referring MD/NP/PA: Kommor  Outpatient Primary MD for the patient is Clovis Riley, L.August Saucer, MD  Patient coming from: Home  Chief complaint-abdominal pain   HPI:    Gail Lee  is a 26 y.o. female, with history of PCOS and thyroid disease, presents the ED with a chief complaint of abdominal pain.  It started at 8:30 AM and woke her up from sleep.  It was progressively worse until about 10 AM.  Then it plateaued.  It feels like a cramp, with more pressure.  It is constant.  It is worse with engagement of her abdominal muscles, going over bumps in the car, and palpation.  She has had nausea associated with the severity of the pain.  No vomiting.  Her last normal meal was 9:45 PM the night before.  Her last normal bowel movement was at 8:30 AM when the pain started.  She reports it was not loose or with melena or hematochezia.  She describes the pain as being midline in her lower abdomen, but on exam it is more right lower quadrant.  Patient reports she has never had anything like this before.  Patient has no other complaints at this time.  Patient does not smoke, drinks alcohol about twice a year, does not use illicit drugs, is vaccinated for COVID.  Patient is full code.  In the ED Temp 97.4, heart rate 85-108, respiratory 20, blood pressure 122/81, satting 97% White blood cell count 16.6, hemoglobin 15.8, platelets 286 Chemistry panel is unremarkable Negative COVID Appendicitis on CT abdomen Ultrasound shows PCOS with no mass or torsion of the ovaries Patient was started on Rocephin, given Toradol, given 1 L of LR, and 4 mg of morphine    Review of systems:    In addition to the HPI above,  No Fever-chills, No Headache, No changes with Vision or hearing, No problems swallowing  food or Liquids, No Chest pain, Cough or Shortness of Breath, No Blood in stool or Urine, No dysuria, No new skin rashes or bruises, No new joints pains-aches,  No new weakness, tingling, numbness in any extremity, No recent weight gain or loss, No polyuria, polydypsia or polyphagia, No significant Mental Stressors.  All other systems reviewed and are negative.    Past History of the following :    Past Medical History:  Diagnosis Date   Headache(784.0)    PCOS (polycystic ovarian syndrome)    Thyroid disease       History reviewed. No pertinent surgical history.    Social History:      Social History   Tobacco Use   Smoking status: Never   Smokeless tobacco: Never  Substance Use Topics   Alcohol use: No       Family History :    History reviewed. No pertinent family history. Family history of hypertension   Home Medications:   Prior to Admission medications  Medication Sig Start Date End Date Taking? Authorizing Provider  levothyroxine (SYNTHROID) 50 MCG tablet Take 50 mcg by mouth daily before breakfast.   Yes [provider]     Allergies:    No Known Allergies   Physical Exam:   Vitals  Blood pressure 127/82, pulse (!) 108, temperature 98.6 F (37 C), temperature source Oral, resp. rate 18, height 5\' 1"  (1.549 m), weight 71.1 kg, last menstrual period 06/20/2021, SpO2 100 %.  1.  General: Patient lying supine in bed,  no acute distress   2. Psychiatric: Alert and oriented x 3, mood and behavior normal for situation, pleasant and cooperative with exam   3. Neurologic: Speech and language are normal, face is symmetric, moves all 4 extremities voluntarily, at baseline without acute deficits on limited exam   4. HEENMT:  Head is atraumatic, normocephalic, pupils reactive to light, neck is supple, trachea is midline, mucous membranes are moist   5. Respiratory : Lungs are clear to auscultation bilaterally without wheezing, rhonchi,  rales, no cyanosis, no increase in work of breathing or accessory muscle use   6. Cardiovascular : Heart rate normal, rhythm is regular, no murmurs, rubs or gallops, no peripheral edema, peripheral pulses palpated   7. Gastrointestinal:  Abdomen is soft, nondistended, tender to palpation at the midline lower quadrant and McBurney's point, rebound tenderness, voluntary guarding, bowel sounds active, no masses or organomegaly palpated   8. Skin:  Skin is warm, dry and intact without rashes, acute lesions, or ulcers on limited exam   9.Musculoskeletal:  No acute deformities or trauma, no asymmetry in tone, no peripheral edema, peripheral pulses palpated, no tenderness to palpation in the extremities     Data Review:    CBC Recent Labs  Lab 07/05/21 1747  WBC 16.6*  HGB 15.8*  HCT 47.9*  PLT 286  MCV 99.2  MCH 32.7  MCHC 33.0  RDW 11.5   ------------------------------------------------------------------------------------------------------------------  Results for orders placed or performed during the hospital encounter of 07/05/21 (from the past 48 hour(s))  Lipase, blood     Status: None   Collection Time: 07/05/21  5:47 PM  Result Value Ref Range   Lipase 23 11 - 51 U/L    Comment: Performed at [facility], [address] [place], [place] [address]  Comprehensive metabolic panel     Status: Abnormal   Collection Time: 07/05/21  5:47 PM  Result Value Ref Range   Sodium 134 (L) 135 - 145 mmol/L   Potassium 3.4 (L) 3.5 - 5.1 mmol/L   Chloride 102 98 - 111 mmol/L   CO2 23 22 - 32 mmol/L   Glucose, Bld 110 (H) 70 - 99 mg/dL    Comment: Glucose reference range applies only to samples taken after fasting for at least 8 hours.   BUN 10 6 - 20 mg/dL   Creatinine, Ser 1.01 0.44 - 1.00 mg/dL   Calcium 9.0 8.9 - 75.1 mg/dL   Total Protein 7.9 6.5 - 8.1 g/dL   Albumin 4.9 3.5 - 5.0 g/dL   AST 26 15 - 41 U/L   ALT 42 0 - 44 U/L   Alkaline Phosphatase 87 38 - 126 U/L   Total  Bilirubin 0.6 0.3 - 1.2 mg/dL   GFR, Estimated >02 >58 mL/min    Comment: (NOTE) Calculated using the CKD-EPI Creatinine Equation (2021)    Anion gap 9 5 - 15    Comment: Performed at [facility], [address], [place], [place]  [address]  CBC     Status: Abnormal   Collection Time: 07/05/21  5:47 PM  Result Value Ref Range   WBC 16.6 (H) 4.0 - 10.5 K/uL   RBC 4.83 3.87 - 5.11 MIL/uL   Hemoglobin 15.8 (H) 12.0 - 15.0 g/dL   HCT 60.4 (H) 54.0 - 98.1 %   MCV 99.2 80.0 - 100.0 fL   MCH 32.7 26.0 - 34.0 pg   MCHC 33.0 30.0 - 36.0 g/dL   RDW 19.1 47.8 - 29.5 %   Platelets 286 150 - 400 K/uL   nRBC 0.0 0.0 - 0.2 %    Comment: Performed at [facility], [address] [place], [place] [address]  POC urine preg, ED     Status: None   Collection Time: 07/05/21  8:01 PM  Result Value Ref Range   Preg Test, Ur NEGATIVE NEGATIVE    Comment:        THE SENSITIVITY OF THIS METHODOLOGY IS >24 mIU/mL   Resp Panel by RT-PCR (Flu A&B, Covid) Nasopharyngeal Swab     Status: None   Collection Time: 07/05/21 11:20 PM   Specimen: Nasopharyngeal Swab; Nasopharyngeal(NP) swabs in vial transport medium  Result Value Ref Range   SARS Coronavirus 2 by RT PCR NEGATIVE NEGATIVE    Comment: (NOTE) SARS-CoV-2 target nucleic acids are NOT DETECTED.  The SARS-CoV-2 RNA is generally detectable in upper respiratory specimens during the acute phase of infection. The lowest concentration of SARS-CoV-2 viral copies this assay can detect is 138 copies/mL. A negative result does not preclude SARS-Cov-2 infection and should not be used as the sole basis for treatment or other patient management decisions. A negative result may occur with  improper specimen collection/handling, submission of specimen other than nasopharyngeal swab, presence of viral mutation(s) within the areas targeted by this assay, and inadequate number of viral copies(<138 copies/mL). A negative result must be combined with clinical  observations, patient history, and epidemiological information. The expected result is Negative.  Fact Sheet for Patients:  BloggerCourse.com  Fact Sheet for Healthcare Providers:  SeriousBroker.it  This test is no t yet approved or cleared by the [place] FDA and  has been authorized for detection and/or diagnosis of SARS-CoV-2 by FDA under an Emergency Use Authorization (EUA). This EUA will remain  in effect (meaning this test can be used) for the duration of the COVID-19 declaration under Section 564(b)(1) of the Act, 21 U.S.C.section 360bbb-3(b)(1), unless the authorization is terminated  or revoked sooner.       Influenza A by PCR NEGATIVE NEGATIVE   Influenza B by PCR NEGATIVE NEGATIVE    Comment: (NOTE) The Xpert Xpress SARS-CoV-2/FLU/RSV plus assay is intended as an aid in the diagnosis of influenza from Nasopharyngeal swab specimens and should not be used as a sole basis for treatment. Nasal washings and aspirates are unacceptable for Xpert Xpress SARS-CoV-2/FLU/RSV testing.  Fact Sheet for Patients: BloggerCourse.com  Fact Sheet for Healthcare Providers: SeriousBroker.it  This test is not yet approved or cleared by the [place] FDA and has been authorized for detection and/or diagnosis of SARS-CoV-2 by FDA under an Emergency Use Authorization (EUA). This EUA will remain in effect (meaning this test can be used) for the duration of the COVID-19 declaration under Section 564(b)(1) of the Act, 21 U.S.C. section 360bbb-3(b)(1), unless the authorization is terminated or revoked.  Performed at [facility], [address] [place], [place] [address]   Surgical PCR screen     Status: None  Collection Time: 07/06/21  1:44 AM   Specimen: Nasal Mucosa; Nasal Swab  Result Value Ref Range   MRSA, PCR NEGATIVE NEGATIVE   Staphylococcus aureus NEGATIVE NEGATIVE     Comment: (NOTE) The Xpert SA Assay (FDA approved for NASAL specimens in patients 70 years of age and older), is one component of a comprehensive surveillance program. It is not intended to diagnose infection nor to guide or monitor treatment. Performed at [facility], [address], [place], [place] [address]     Chemistries  Recent Labs  Lab 07/05/21 1747  NA 134*  K 3.4*  CL 102  CO2 23  GLUCOSE 110*  BUN 10  CREATININE 0.62  CALCIUM 9.0  AST 26  ALT 42  ALKPHOS 87  BILITOT 0.6   ------------------------------------------------------------------------------------------------------------------  ------------------------------------------------------------------------------------------------------------------ GFR: Estimated Creatinine Clearance: 96.1 mL/min (by C-G formula based on SCr of 0.62 mg/dL). Liver Function Tests: Recent Labs  Lab 07/05/21 1747  AST 26  ALT 42  ALKPHOS 87  BILITOT 0.6  PROT 7.9  ALBUMIN 4.9   Recent Labs  Lab 07/05/21 1747  LIPASE 23   No results for input(s): AMMONIA in the last 168 hours. Coagulation Profile: No results for input(s): INR, PROTIME in the last 168 hours. Cardiac Enzymes: No results for input(s): CKTOTAL, CKMB, CKMBINDEX, TROPONINI in the last 168 hours. BNP (last 3 results) No results for input(s): PROBNP in the last 8760 hours. HbA1C: No results for input(s): HGBA1C in the last 72 hours. CBG: No results for input(s): GLUCAP in the last 168 hours. Lipid Profile: No results for input(s): CHOL, HDL, LDLCALC, TRIG, CHOLHDL, LDLDIRECT in the last 72 hours. Thyroid Function Tests: No results for input(s): TSH, T4TOTAL, FREET4, T3FREE, THYROIDAB in the last 72 hours. Anemia Panel: No results for input(s): VITAMINB12, FOLATE, FERRITIN, TIBC, IRON, RETICCTPCT in the last 72 hours.  --------------------------------------------------------------------------------------------------------------- Urine analysis:     Component Value Date/Time   COLORURINE YELLOW 03/23/2011 1943   APPEARANCEUR HAZY (A) 03/23/2011 1943   LABSPEC 1.024 03/23/2011 1943   PHURINE 7.0 03/23/2011 1943   GLUCOSEU NEGATIVE 03/23/2011 1943   HGBUR NEGATIVE 03/23/2011 1943   BILIRUBINUR NEGATIVE 03/23/2011 1943   KETONESUR NEGATIVE 03/23/2011 1943   PROTEINUR NEGATIVE 03/23/2011 1943   UROBILINOGEN 1.0 03/23/2011 1943   NITRITE NEGATIVE 03/23/2011 1943   LEUKOCYTESUR SMALL (A) 03/23/2011 1943      Imaging Results:    US Transvaginal Non-OB  Result Date: 07/05/2021 CLINICAL DATA:  Rule out torsion.  Pelvic pain for 1 day. EXAM: TRANSABDOMINAL AND TRANSVAGINAL ULTRASOUND OF PELVIS DOPPLER ULTRASOUND OF OVARIES TECHNIQUE: Both transabdominal and transvaginal ultrasound examinations of the pelvis were performed. Transabdominal technique was performed for global imaging of the pelvis including uterus, ovaries, adnexal regions, and pelvic cul-de-sac. It was necessary to proceed with endovaginal exam following the transabdominal exam to visualize the ovaries and endometrium. Color and duplex Doppler ultrasound was utilized to evaluate blood flow to the ovaries. COMPARISON:  05/30/2013 FINDINGS: Uterus Measurements: 7.3 x 3.3 x 4 cm = volume: 50 mL. Anteverted uterus. No myometrial mass or abnormality identified. Endometrium Thickness: 11.5 mm.  No focal abnormality visualized. Right ovary Measurements: 4.4 x 3.1 x 4.2 cm = volume: 30 mL. Right ovary is prominent in size with multiple follicles arranged peripherally consistent with history of polycystic ovary disease. No dominant mass identified. Flow is demonstrated in the right ovary on color flow Doppler imaging. Left ovary Measurements: 5.2 x 2.8 x 3.7 cm = volume: 28 mL. Left ovary  is prominent in size with multiple follicles arranged peripherally consistent with history of polycystic ovarian disease. No dominant mass identified. Flow is demonstrated in the left ovary on color flow  Doppler imaging. Pulsed Doppler evaluation of both ovaries demonstrates normal low-resistance arterial and venous waveforms. Other findings Small amount of free fluid in the cul-de-sac and around the ovaries. This is nonspecific but likely physiologic. IMPRESSION: 1. Appearance and configuration of the ovaries is consistent with history of polycystic ovarian disease. No evidence of abnormal mass or torsion. 2. Small amount of free fluid is likely physiologic. 3. Normal ultrasound appearance of the uterus. Electronically Signed   By: Burman Nieves M.D.   On: 07/05/2021 21:39   US Pelvis Complete  Result Date: 07/05/2021 CLINICAL DATA:  Rule out torsion.  Pelvic pain for 1 day. EXAM: TRANSABDOMINAL AND TRANSVAGINAL ULTRASOUND OF PELVIS DOPPLER ULTRASOUND OF OVARIES TECHNIQUE: Both transabdominal and transvaginal ultrasound examinations of the pelvis were performed. Transabdominal technique was performed for global imaging of the pelvis including uterus, ovaries, adnexal regions, and pelvic cul-de-sac. It was necessary to proceed with endovaginal exam following the transabdominal exam to visualize the ovaries and endometrium. Color and duplex Doppler ultrasound was utilized to evaluate blood flow to the ovaries. COMPARISON:  05/30/2013 FINDINGS: Uterus Measurements: 7.3 x 3.3 x 4 cm = volume: 50 mL. Anteverted uterus. No myometrial mass or abnormality identified. Endometrium Thickness: 11.5 mm.  No focal abnormality visualized. Right ovary Measurements: 4.4 x 3.1 x 4.2 cm = volume: 30 mL. Right ovary is prominent in size with multiple follicles arranged peripherally consistent with history of polycystic ovary disease. No dominant mass identified. Flow is demonstrated in the right ovary on color flow Doppler imaging. Left ovary Measurements: 5.2 x 2.8 x 3.7 cm = volume: 28 mL. Left ovary is prominent in size with multiple follicles arranged peripherally consistent with history of polycystic ovarian disease. No  dominant mass identified. Flow is demonstrated in the left ovary on color flow Doppler imaging. Pulsed Doppler evaluation of both ovaries demonstrates normal low-resistance arterial and venous waveforms. Other findings Small amount of free fluid in the cul-de-sac and around the ovaries. This is nonspecific but likely physiologic. IMPRESSION: 1. Appearance and configuration of the ovaries is consistent with history of polycystic ovarian disease. No evidence of abnormal mass or torsion. 2. Small amount of free fluid is likely physiologic. 3. Normal ultrasound appearance of the uterus. Electronically Signed   By: Burman Nieves M.D.   On: 07/05/2021 21:39   CT ABDOMEN PELVIS W CONTRAST  Result Date: 07/05/2021 CLINICAL DATA:  Right lower quadrant abdominal pain since this morning. Nausea. EXAM: CT ABDOMEN AND PELVIS WITH CONTRAST TECHNIQUE: Multidetector CT imaging of the abdomen and pelvis was performed using the standard protocol following bolus administration of intravenous contrast. CONTRAST:  29mL OMNIPAQUE IOHEXOL 350 MG/ML SOLN COMPARISON:  11/19/2010 FINDINGS: Lower chest: Lung bases are clear. Hepatobiliary: No focal liver abnormality is seen. No gallstones, gallbladder wall thickening, or biliary dilatation. Pancreas: Unremarkable. No pancreatic ductal dilatation or surrounding inflammatory changes. Spleen: Normal in size without focal abnormality. Adrenals/Urinary Tract: Adrenal glands are unremarkable. Kidneys are normal, without renal calculi, focal lesion, or hydronephrosis. Bladder is unremarkable. Stomach/Bowel: Stomach, small bowel, and colon are not abnormally distended. No wall thickening or inflammatory changes are appreciated. The appendix is distended and fluid-filled with appendiceal diameter measuring about 1.3 cm. Minimal if any inflammatory stranding around the appendix. Minimal free fluid around the appendix. No loculated collections. Changes are likely to indicate  early acute  appendicitis although an appendiceal mucocele could possibly also have this appearance. Appendix: Location: Medial to the cecum extending towards the midline with tip anterior to the iliopsoas muscle. Diameter: 1.3 cm Appendicolith: No Mucosal hyper-enhancement: No Extraluminal gas: No Periappendiceal collection: No Vascular/Lymphatic: No significant vascular findings are present. No enlarged abdominal or pelvic lymph nodes. Reproductive: Uterus is not enlarged. Prominent appearance of the ovaries, similar to prior study, consistent with history of polycystic ovarian disease. Other: Small amount of free fluid in the pelvis may be physiologic or reactive. Musculoskeletal: No acute or significant osseous findings. IMPRESSION: 1. Distended fluid-filled appendix at 1.3 cm diameter, likely appendicitis. No abscess. 2. Prominence of the ovaries consistent with history of polycystic ovarian disease. Electronically Signed   By: Burman Nieves M.D.   On: 07/05/2021 21:43   [place] Art/Ven Flow Abd Pelv Doppler  Result Date: 07/05/2021 CLINICAL DATA:  Rule out torsion.  Pelvic pain for 1 day. EXAM: TRANSABDOMINAL AND TRANSVAGINAL ULTRASOUND OF PELVIS DOPPLER ULTRASOUND OF OVARIES TECHNIQUE: Both transabdominal and transvaginal ultrasound examinations of the pelvis were performed. Transabdominal technique was performed for global imaging of the pelvis including uterus, ovaries, adnexal regions, and pelvic cul-de-sac. It was necessary to proceed with endovaginal exam following the transabdominal exam to visualize the ovaries and endometrium. Color and duplex Doppler ultrasound was utilized to evaluate blood flow to the ovaries. COMPARISON:  05/30/2013 FINDINGS: Uterus Measurements: 7.3 x 3.3 x 4 cm = volume: 50 mL. Anteverted uterus. No myometrial mass or abnormality identified. Endometrium Thickness: 11.5 mm.  No focal abnormality visualized. Right ovary Measurements: 4.4 x 3.1 x 4.2 cm = volume: 30 mL. Right ovary is  prominent in size with multiple follicles arranged peripherally consistent with history of polycystic ovary disease. No dominant mass identified. Flow is demonstrated in the right ovary on color flow Doppler imaging. Left ovary Measurements: 5.2 x 2.8 x 3.7 cm = volume: 28 mL. Left ovary is prominent in size with multiple follicles arranged peripherally consistent with history of polycystic ovarian disease. No dominant mass identified. Flow is demonstrated in the left ovary on color flow Doppler imaging. Pulsed Doppler evaluation of both ovaries demonstrates normal low-resistance arterial and venous waveforms. Other findings Small amount of free fluid in the cul-de-sac and around the ovaries. This is nonspecific but likely physiologic. IMPRESSION: 1. Appearance and configuration of the ovaries is consistent with history of polycystic ovarian disease. No evidence of abnormal mass or torsion. 2. Small amount of free fluid is likely physiologic. 3. Normal ultrasound appearance of the uterus. Electronically Signed   By: Burman Nieves M.D.   On: 07/05/2021 21:39       Assessment & Plan:    Active Problems:   Appendicitis   Appendicitis Continue Zosyn Continue pain control N.p.o. General surgery to see patient in the a.m., plan for appendectomy Thyroid disease Continue Synthroid   DVT Prophylaxis-   Heparin- SCDs   AM Labs Ordered, also please review Full Orders  Family Communication: Admission, patients condition and plan of care including tests being ordered have been discussed with the patient and husband who indicate understanding and agree with the plan and Code Status.  Code Status: Full  Admission status: Inpatient :The appropriate admission status for this patient is INPATIENT. Inpatient status is judged to be reasonable and necessary in order to provide the required intensity of service to ensure the patient's safety. The patient's presenting symptoms, physical exam findings, and  initial radiographic and laboratory data in the context  of their chronic comorbidities is felt to place them at high risk for further clinical deterioration. Furthermore, it is not anticipated that the patient will be medically stable for discharge from the hospital within 2 midnights of admission. The following factors support the admission status of inpatient.     The patient's presenting symptoms include abdominal pain. The worrisome physical exam findings include peritoneal signs. The initial radiographic and laboratory data are worrisome because of leukocytosis, appendicitis on CT. The chronic co-morbidities include thyroid disease.       * I certify that at the point of admission it is my clinical judgment that the patient will require inpatient hospital care spanning beyond 2 midnights from the point of admission due to high intensity of service, high risk for further deterioration and high frequency of surveillance required.*  Time spent in minutes : 65   Kimbria Camposano B Zierle-Ghosh DO

## 2021-07-07 ENCOUNTER — Encounter (HOSPITAL_COMMUNITY): Payer: Self-pay | Admitting: General Surgery

## 2021-07-08 LAB — SURGICAL PATHOLOGY

## 2021-07-22 ENCOUNTER — Ambulatory Visit (INDEPENDENT_AMBULATORY_CARE_PROVIDER_SITE_OTHER): Payer: BC Managed Care – PPO | Admitting: General Surgery

## 2021-07-22 DIAGNOSIS — K3580 Unspecified acute appendicitis: Secondary | ICD-10-CM

## 2021-07-22 NOTE — Progress Notes (Signed)
[facility]  I am calling the patient for post operative evaluation. This is not a billable encounter as it is under the global charges for the surgery.  The patient had a laparoscopic appendectomy on 07/06/2021. The patient's number went to VM. Left a message telling her to call with questions or concerns. Normal expected pathology with acute appendicitis.   Pathology: FINAL MICROSCOPIC DIAGNOSIS:   A. APPENDIX, APPENDECTOMY:  - Acute suppurative appendicitis with acute serositis.   Will see the patient PRN.   Algis Greenhouse, MD [facility] [address] [place], [place] [address] [phone] (office)

## 2021-10-06 DIAGNOSIS — Z20818 Contact with and (suspected) exposure to other bacterial communicable diseases: Secondary | ICD-10-CM | POA: Diagnosis not present

## 2021-10-06 DIAGNOSIS — U071 COVID-19: Secondary | ICD-10-CM | POA: Diagnosis not present

## 2022-04-03 DIAGNOSIS — E039 Hypothyroidism, unspecified: Secondary | ICD-10-CM | POA: Diagnosis not present

## 2022-04-03 DIAGNOSIS — Z01419 Encounter for gynecological examination (general) (routine) without abnormal findings: Secondary | ICD-10-CM | POA: Diagnosis not present

## 2022-04-03 DIAGNOSIS — Z6828 Body mass index (BMI) 28.0-28.9, adult: Secondary | ICD-10-CM | POA: Diagnosis not present

## 2022-05-29 DIAGNOSIS — N914 Secondary oligomenorrhea: Secondary | ICD-10-CM | POA: Diagnosis not present

## 2022-05-29 DIAGNOSIS — Z3202 Encounter for pregnancy test, result negative: Secondary | ICD-10-CM | POA: Diagnosis not present

## 2022-05-29 DIAGNOSIS — N939 Abnormal uterine and vaginal bleeding, unspecified: Secondary | ICD-10-CM | POA: Diagnosis not present

## 2022-09-15 ENCOUNTER — Other Ambulatory Visit: Payer: Self-pay

## 2022-09-15 ENCOUNTER — Encounter: Payer: Self-pay | Admitting: Emergency Medicine

## 2022-09-15 ENCOUNTER — Ambulatory Visit
Admission: EM | Admit: 2022-09-15 | Discharge: 2022-09-15 | Disposition: A | Discharge status: Home / Self Care | Payer: BC Managed Care – PPO | Attending: Family Medicine | Admitting: Family Medicine

## 2022-09-15 DIAGNOSIS — J029 Acute pharyngitis, unspecified: Secondary | ICD-10-CM | POA: Diagnosis not present

## 2022-09-15 LAB — POCT RAPID STREP A (OFFICE): Rapid Strep A Screen: NEGATIVE

## 2022-09-15 NOTE — ED Triage Notes (Signed)
Pt reports left sided throat pain since eating lunch/solid food this afternoon. Pt reports no pain with drinking PO fluids.  Denies any known fevers.

## 2022-09-15 NOTE — ED Provider Notes (Signed)
[facility]    CSN: [id number] Arrival date & time: 09/15/22  1854      History   Chief Complaint Chief Complaint  Patient presents with   Sore Throat    Sharp Pain in throat when swallowing solid food. - Entered by patient    HPI Gail Lee is a 26 y.o. female.   Presenting today with 1 day history of left-sided throat pain worse with eating and swallowing solids.  Denies congestion, cough, fever, chills, body aches, headaches.  So far not trying anything over-the-counter for symptoms.  States she has a history of strep infections so wanted to get checked out.    Past Medical History:  Diagnosis Date   Headache(784.0)    PCOS (polycystic ovarian syndrome)    Thyroid disease     Patient Active Problem List   Diagnosis Date Noted   Appendicitis 07/05/2021    Past Surgical History:  Procedure Laterality Date   LAPAROSCOPIC APPENDECTOMY N/A 07/06/2021   Procedure: APPENDECTOMY LAPAROSCOPIC;  Surgeon: Lucretia Roers, MD;  Location: [facility];  Service: General;  Laterality: N/A;    OB History   No obstetric history on file.      Home Medications    Prior to Admission medications   Medication Sig Start Date End Date Taking? Authorizing Provider  levothyroxine (SYNTHROID) 50 MCG tablet Take 50 mcg by mouth daily before breakfast.    [provider]  ondansetron (ZOFRAN) 4 MG tablet Take 1 tablet (4 mg total) by mouth every 6 (six) hours as needed for nausea. 07/06/21   Lucretia Roers, MD  oxyCODONE (OXY IR/ROXICODONE) 5 MG immediate release tablet Take 1 tablet (5 mg total) by mouth every 4 (four) hours as needed for severe pain or breakthrough pain. 07/06/21   Lucretia Roers, MD    Family History History reviewed. No pertinent family history.  Social History Social History   Tobacco Use   Smoking status: Never   Smokeless tobacco: Never  Vaping Use   Vaping Use: Never used  Substance Use Topics   Alcohol use: No   Drug  use: No     Allergies   Patient has no known allergies.   Review of Systems Review of Systems HPI  Physical Exam Triage Vital Signs ED Triage Vitals  Enc Vitals Group     BP 09/15/22 1927 121/83     Pulse Rate 09/15/22 1927 90     Resp 09/15/22 1927 16     Temp 09/15/22 1927 98 F (36.7 C)     Temp Source 09/15/22 1927 Oral     SpO2 09/15/22 1927 99 %     Weight --      Height --      Head Circumference --      Peak Flow --      Pain Score 09/15/22 1929 1     Pain Loc --      Pain Edu? --      Excl. in GC? --    No data found.  Updated Vital Signs BP 121/83 (BP Location: Right Arm)   Pulse 90   Temp 98 F (36.7 C) (Oral)   Resp 16   LMP 08/04/2022 (Approximate) Comment: history of PCOS. recent urine pregnancy was negative.  SpO2 99%   Visual Acuity Right Eye Distance:   Left Eye Distance:   Bilateral Distance:    Right Eye Near:   Left Eye Near:    Bilateral Near:  Physical Exam Vitals and nursing note reviewed.  Constitutional:      Appearance: Normal appearance. She is not ill-appearing.  HENT:     Head: Atraumatic.     Mouth/Throat:     Mouth: Mucous membranes are moist.     Pharynx: Oropharynx is clear. Posterior oropharyngeal erythema present. No oropharyngeal exudate.     Comments: Diffuse pharyngeal erythema, no tonsillar edema or evidence of exudates tonsil stones or ulcerations. Eyes:     Extraocular Movements: Extraocular movements intact.     Conjunctiva/sclera: Conjunctivae normal.  Cardiovascular:     Rate and Rhythm: Normal rate and regular rhythm.     Heart sounds: Normal heart sounds.  Pulmonary:     Effort: Pulmonary effort is normal.     Breath sounds: Normal breath sounds.  Musculoskeletal:        General: Normal range of motion.     Cervical back: Normal range of motion and neck supple.  Lymphadenopathy:     Cervical: No cervical adenopathy.  Skin:    General: Skin is warm and dry.  Neurological:     Mental Status:  She is alert and oriented to person, place, and time.  Psychiatric:        Mood and Affect: Mood normal.        Thought Content: Thought content normal.        Judgment: Judgment normal.      UC Treatments / Results  Labs (all labs ordered are listed, but only abnormal results are displayed) Labs Reviewed  CULTURE, GROUP A STREP [facility])  POCT RAPID STREP A (OFFICE)    EKG   Radiology No results found.  Procedures Procedures (including critical care time)  Medications Ordered in UC Medications - No data to display  Initial Impression / Assessment and Plan / UC Course  I have reviewed the triage vital signs and the nursing notes.  Pertinent labs & imaging results that were available during my care of the patient were reviewed by me and considered in my medical decision making (see chart for details).     Exam very reassuring, rapid strep negative, throat culture pending.  Suspect more of an irritant issue rather than an infectious issue.  Discussed salt water gargles, warm honey tea, ibuprofen, Tylenol, throat lozenges and throat sprays.  Return for worsening symptoms.  Final Clinical Impressions(s) / UC Diagnoses   Final diagnoses:  Sore throat   Discharge Instructions   None    ED Prescriptions   None    PDMP not reviewed this encounter.   Particia Nearing, [place] 09/15/22 2000

## 2022-09-19 LAB — CULTURE, GROUP A STREP (THRC)

## 2023-03-14 IMAGING — CT CT ABD-PELV W/ CM
2 of 4 series · 15 of 46 positions shown, 17 images · IV contrast (Omnipaque or Isovue)
Comparison: 11/19/2010

CLINICAL DATA: Right lower quadrant abdominal pain since this
morning. Nausea.

EXAM:
CT ABDOMEN AND PELVIS WITH CONTRAST
TECHNIQUE: Multidetector CT imaging of the abdomen and pelvis was performed
using the standard protocol following bolus administration of
intravenous contrast.
CONTRAST:  80mL OMNIPAQUE IOHEXOL 350 MG/ML SOLN

[Series 2: axial st · axial · 0.59mm/px · z∈[+412,+832]mm · 12 of 94 slices shown, 14 images]
[im 5/94  soft-tissue]
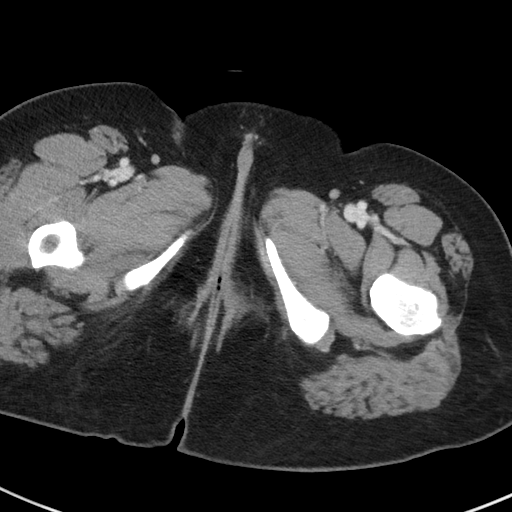
[im 5/94  bone]
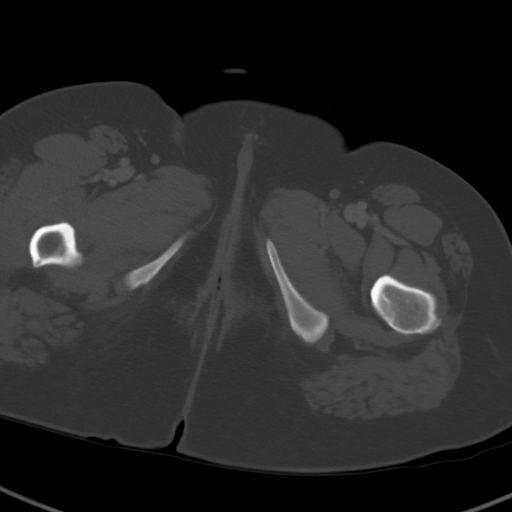
[im 13/94  soft-tissue]
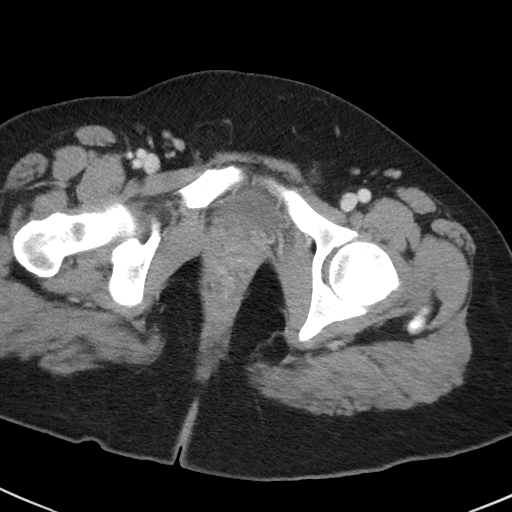
[im 22/94  soft-tissue]
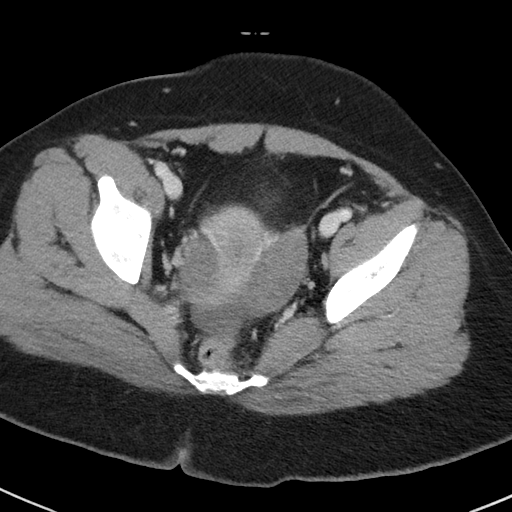
[im 30/94  soft-tissue]
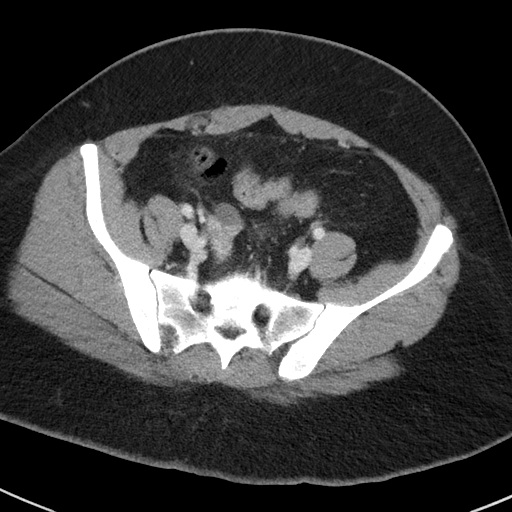
[im 34/94  soft-tissue]
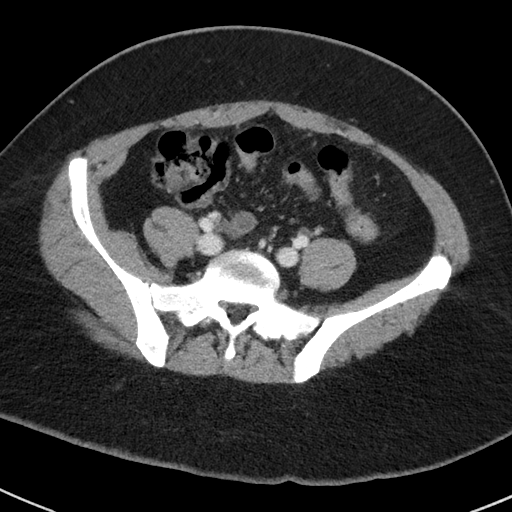
[im 43/94  soft-tissue]
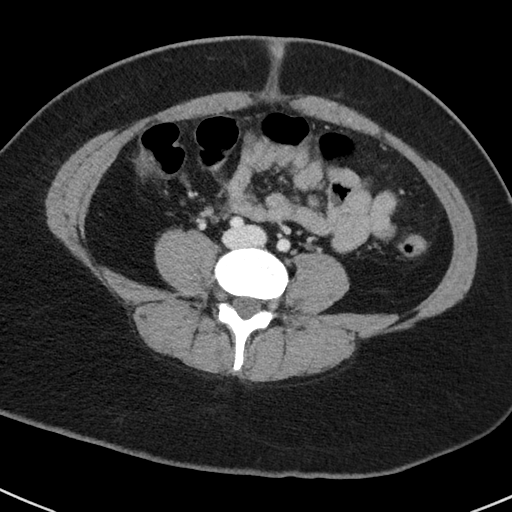
[im 51/94  soft-tissue]
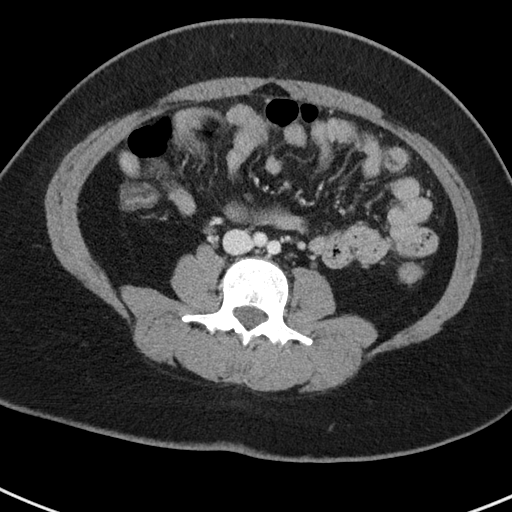
[im 60/94  soft-tissue]
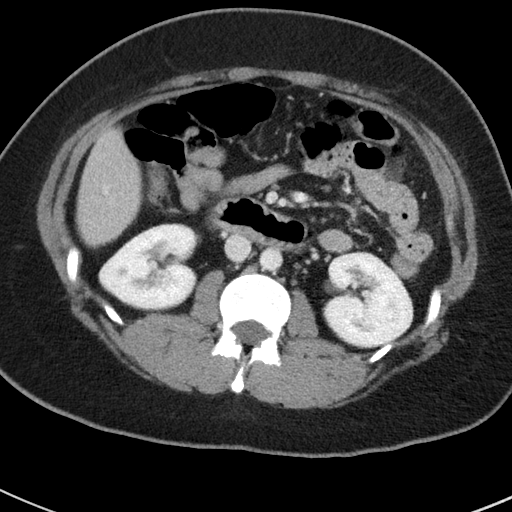
[im 64/94  soft-tissue]
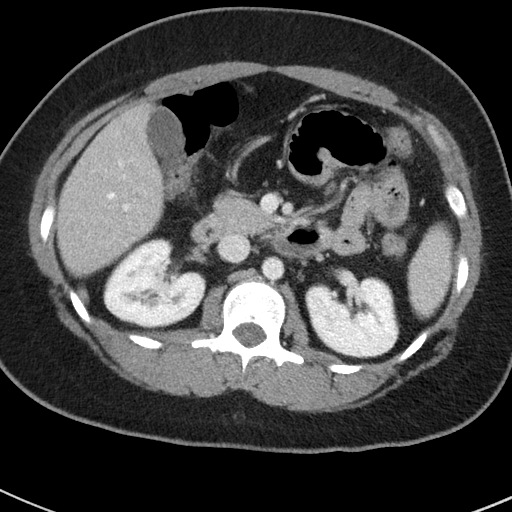
[im 64/94  bone]
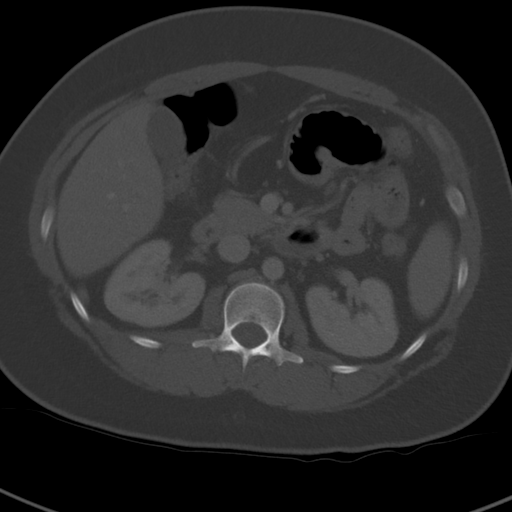
[im 72/94  soft-tissue]
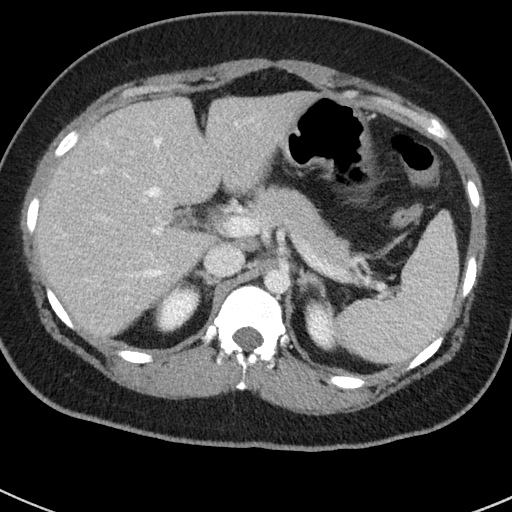
[im 81/94  soft-tissue]
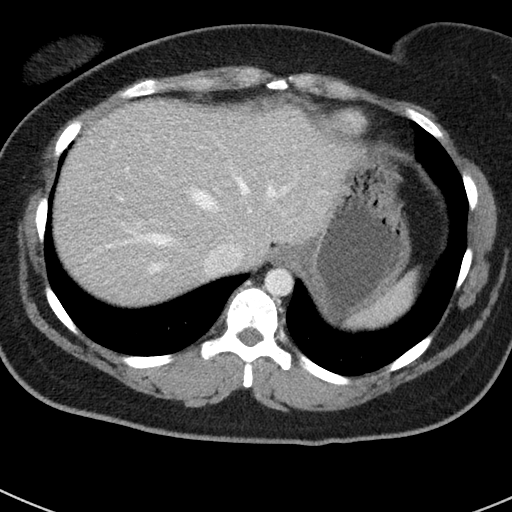
[im 89/94  soft-tissue]
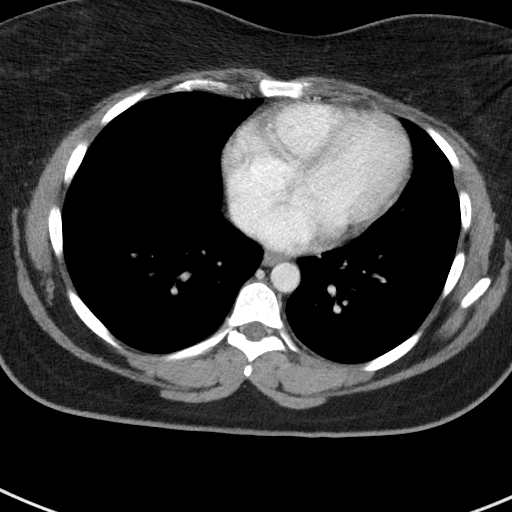

[Series 5: coronal st · coronal · 0.62mm/px · 3 of 117 slices shown]
[im 39/117  soft-tissue]
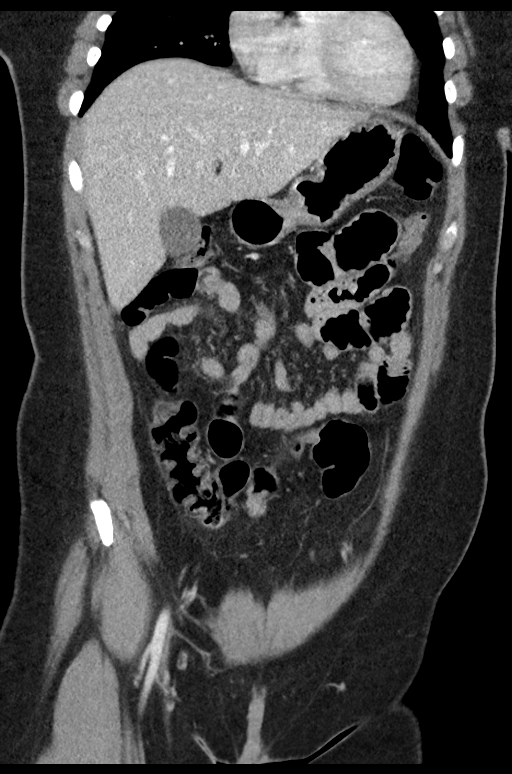
[im 52/117  soft-tissue]
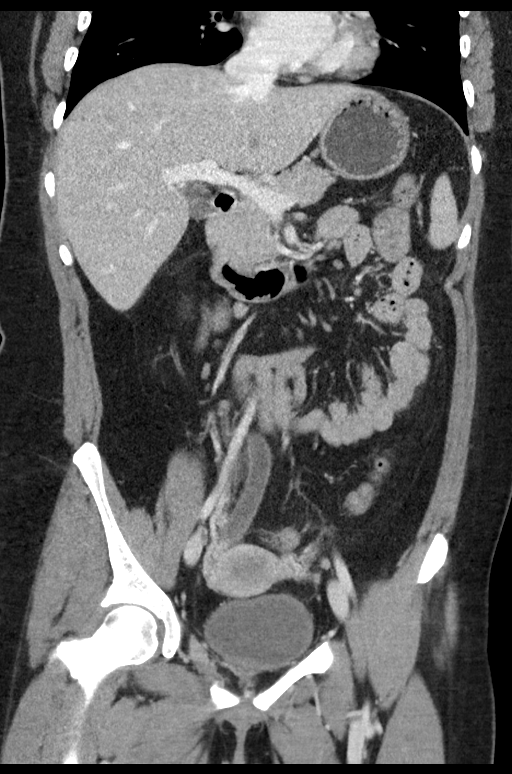
[im 65/117  soft-tissue]
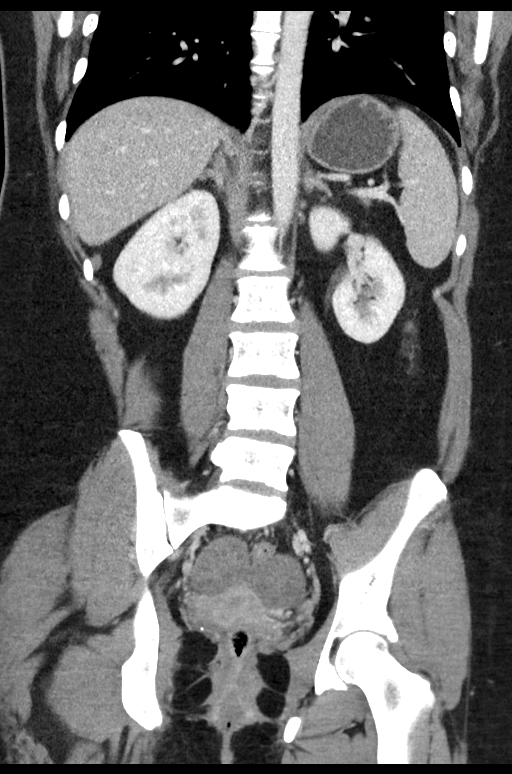

[15 of 46 positions shown; findings below may reference images not displayed]

FINDINGS: Lower chest: Lung bases are clear.

Hepatobiliary: No focal liver abnormality is seen. No gallstones,
gallbladder wall thickening, or biliary dilatation.

Pancreas: Unremarkable. No pancreatic ductal dilatation or
surrounding inflammatory changes.

Spleen: Normal in size without focal abnormality.

Adrenals/Urinary Tract: Adrenal glands are unremarkable. Kidneys are
normal, without renal calculi, focal lesion, or hydronephrosis.
Bladder is unremarkable.

Stomach/Bowel: Stomach, small bowel, and colon are not abnormally
distended. No wall thickening or inflammatory changes are
appreciated. The appendix is distended and fluid-filled with
appendiceal diameter measuring about 1.3 cm. Minimal if any
inflammatory stranding around the appendix. Minimal free fluid
around the appendix. No loculated collections. Changes are likely to
indicate early acute appendicitis although an appendiceal mucocele
could possibly also have this appearance.

Appendix: Location: Medial to the cecum extending towards the
midline with tip anterior to the iliopsoas muscle.

Diameter: 1.3 cm

Appendicolith: No

Mucosal hyper-enhancement: No

Extraluminal gas: No

Periappendiceal collection: No

Vascular/Lymphatic: No significant vascular findings are present. No
enlarged abdominal or pelvic lymph nodes.

Reproductive: Uterus is not enlarged. Prominent appearance of the
ovaries, similar to prior study, consistent with history of
polycystic ovarian disease.

Other: Small amount of free fluid in the pelvis may be physiologic
or reactive.

Musculoskeletal: No acute or significant osseous findings.
IMPRESSION: 1. Distended fluid-filled appendix at 1.3 cm diameter, likely
appendicitis. No abscess.
2. Prominence of the ovaries consistent with history of polycystic
ovarian disease.

## 2023-03-14 IMAGING — US US TRANSVAGINAL NON-OB
1 series · 13 of 25 positions shown · non-contrast
Comparison: 05/30/2013

CLINICAL DATA: Rule out torsion.  Pelvic pain for 1 day.



[Series 1: us pelvis transvaginal non-ob (tv only) · 13 of 124 slices shown]
[im 1/124]
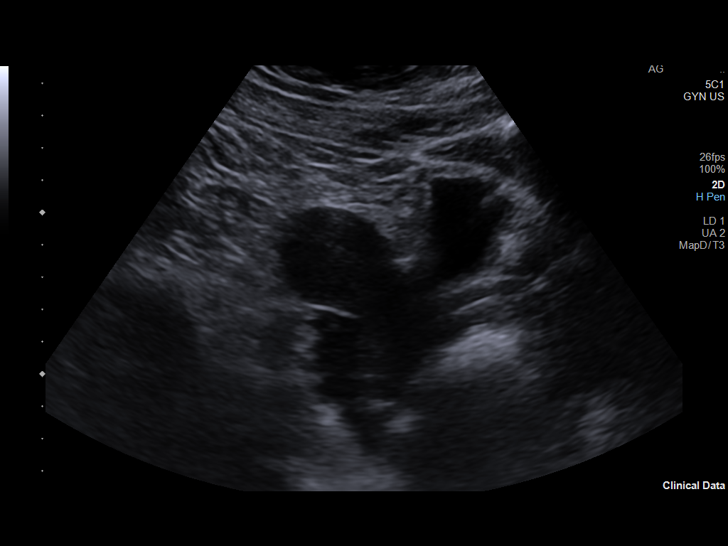
[im 11/124]
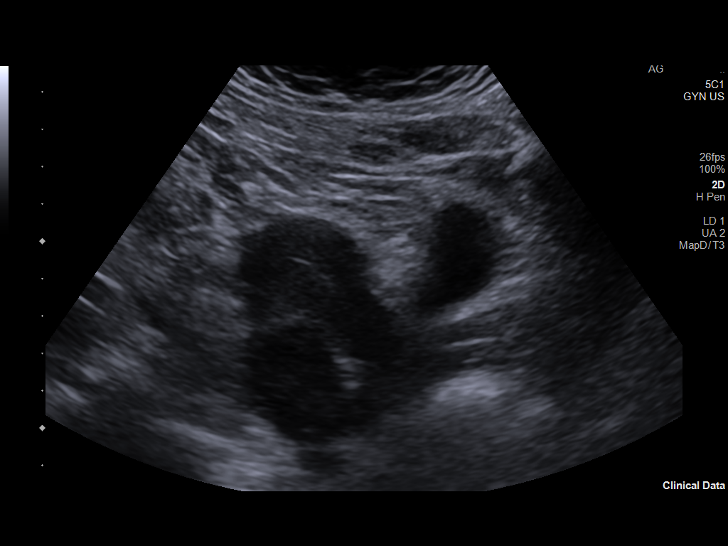
[im 21/124]
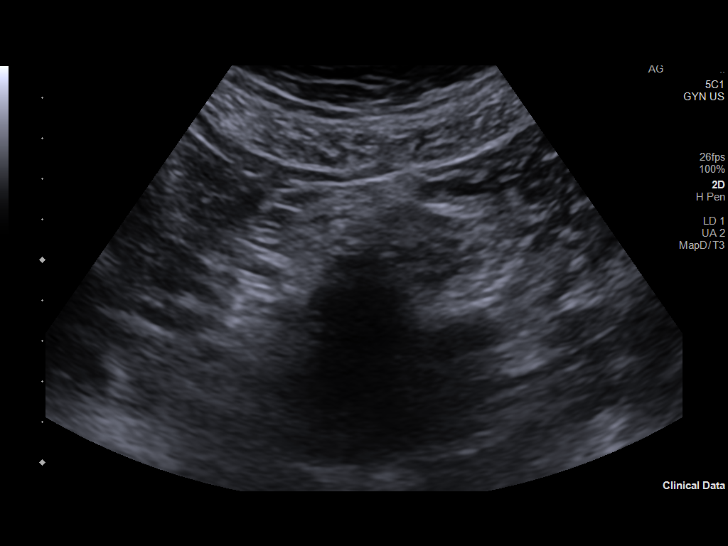
[im 31/124]
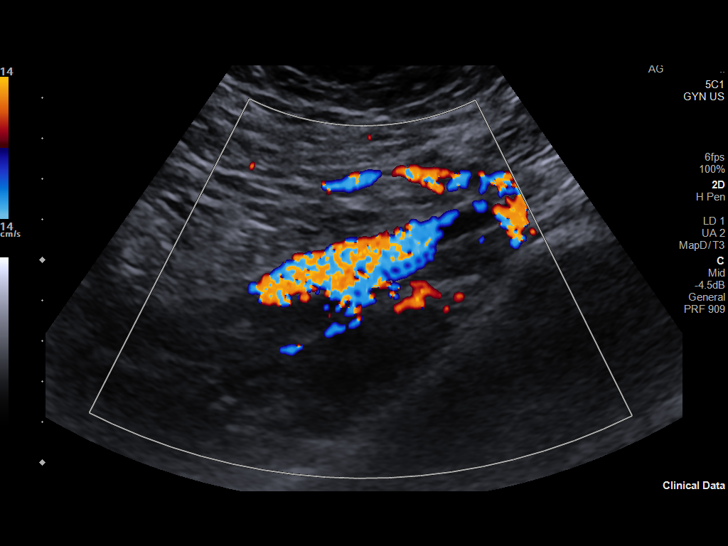
[im 42/124]
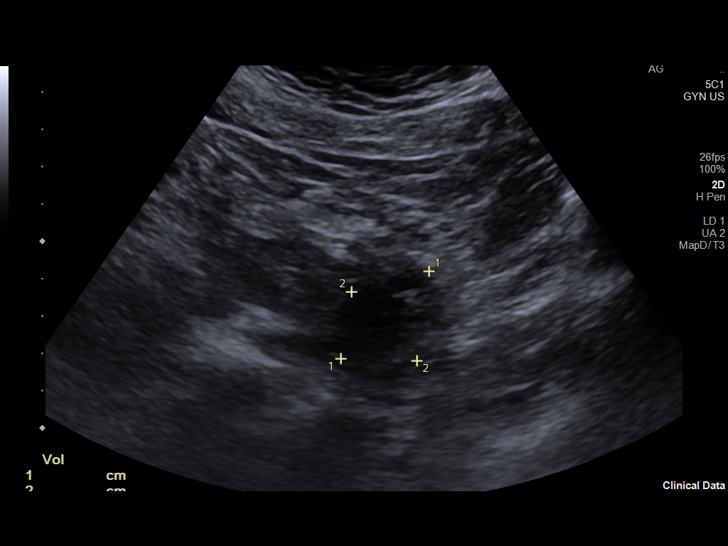
[im 52/124]
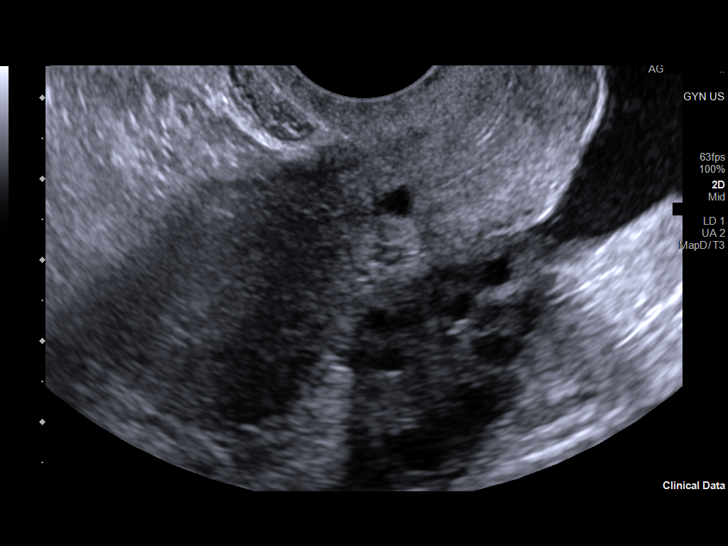
[im 62/124]
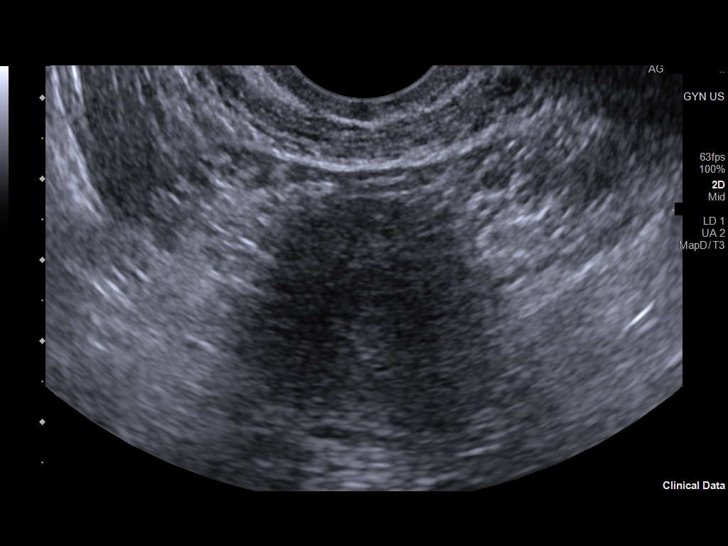
[im 72/124]
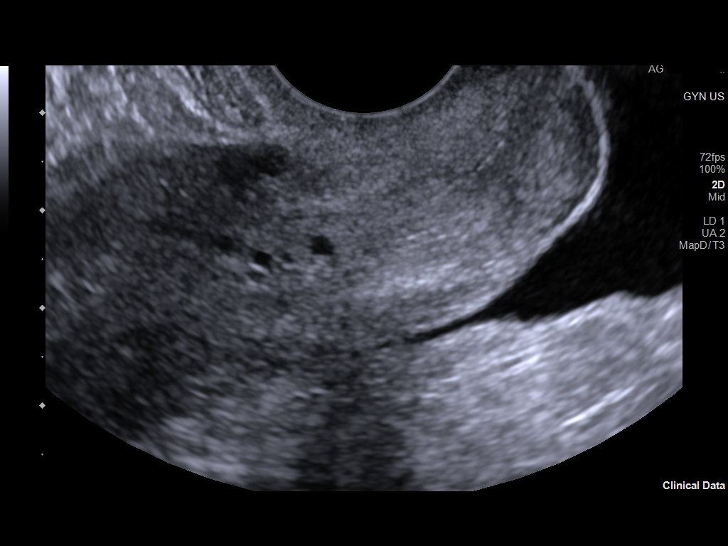
[im 83/124]
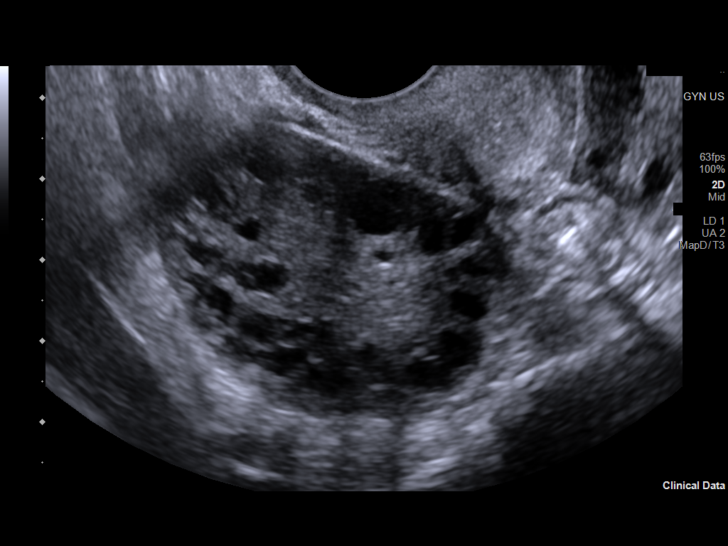
[im 93/124]
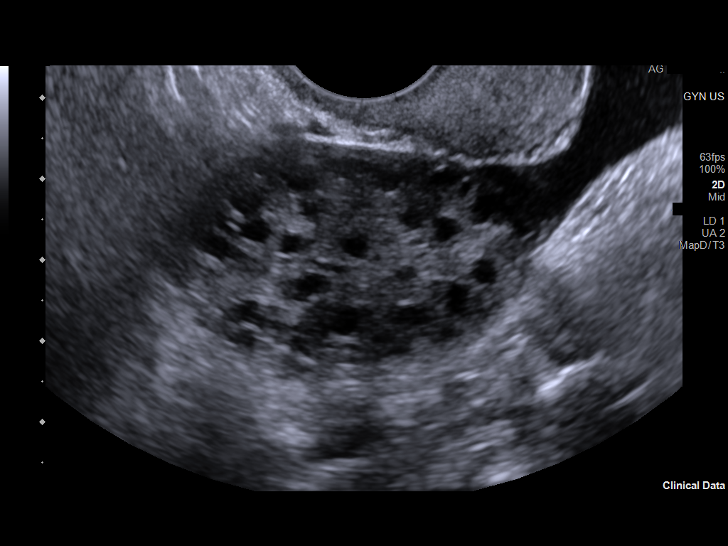
[im 103/124]
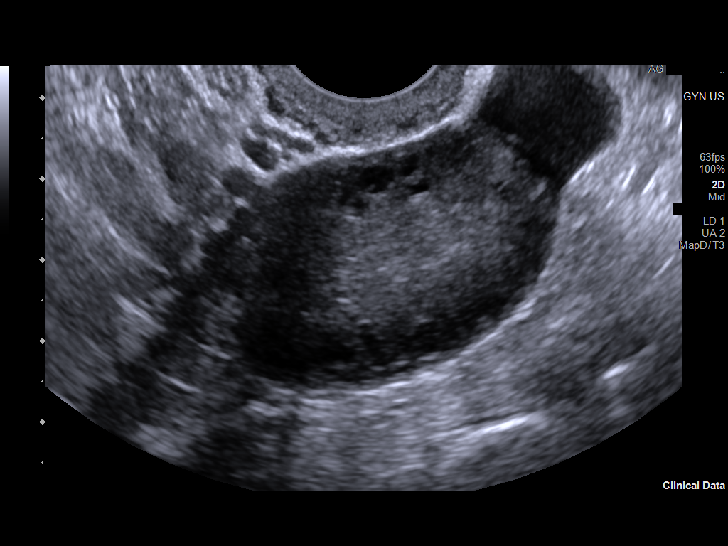
[im 113/124]
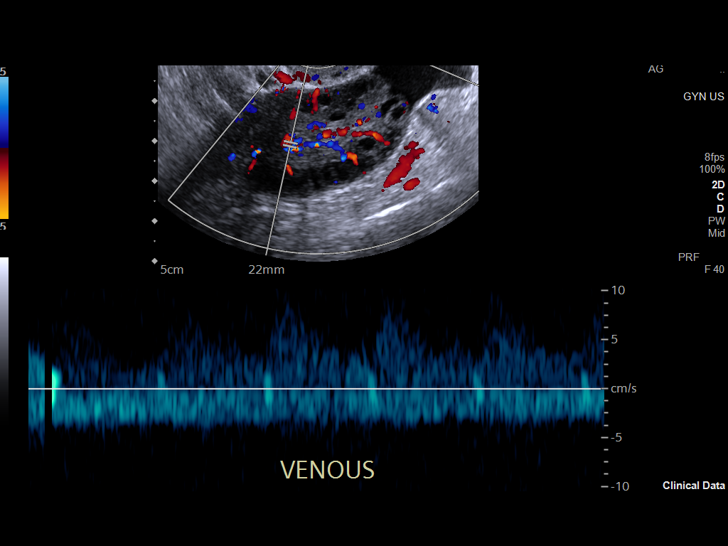
[im 124/124]
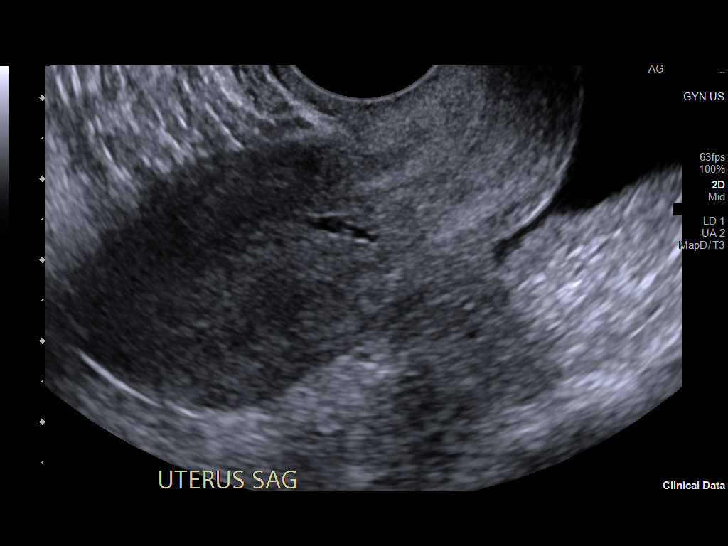

[13 of 25 positions shown; findings below may reference images not displayed]

FINDINGS: Uterus

Measurements: 7.3 x 3.3 x 4 cm = volume: 50 mL. Anteverted uterus.
No myometrial mass or abnormality identified.

Endometrium

Thickness: 11.5 mm.  No focal abnormality visualized.

Right ovary

Measurements: 4.4 x 3.1 x 4.2 cm = volume: 30 mL. Right ovary is
prominent in size with multiple follicles arranged peripherally
consistent with history of polycystic ovary disease. No dominant
mass identified. Flow is demonstrated in the right ovary on color
flow Doppler imaging.

Left ovary

Measurements: 5.2 x 2.8 x 3.7 cm = volume: 28 mL. Left ovary is
prominent in size with multiple follicles arranged peripherally
consistent with history of polycystic ovarian disease. No dominant
mass identified. Flow is demonstrated in the left ovary on color
flow Doppler imaging.

Pulsed Doppler evaluation of both ovaries demonstrates normal
low-resistance arterial and venous waveforms.

Other findings

Small amount of free fluid in the cul-de-sac and around the ovaries.
This is nonspecific but likely physiologic.
IMPRESSION: 1. Appearance and configuration of the ovaries is consistent with
history of polycystic ovarian disease. No evidence of abnormal mass
or torsion.
2. Small amount of free fluid is likely physiologic.
3. Normal ultrasound appearance of the uterus.

## 2023-09-18 ENCOUNTER — Other Ambulatory Visit: Payer: Self-pay

## 2023-09-18 ENCOUNTER — Emergency Department (HOSPITAL_COMMUNITY)
Admission: EM | Admit: 2023-09-18 | Discharge: 2023-09-18 | Disposition: A | Discharge status: Home / Self Care | Payer: BC Managed Care – PPO | Attending: Emergency Medicine | Admitting: Emergency Medicine

## 2023-09-18 ENCOUNTER — Encounter (HOSPITAL_COMMUNITY): Payer: Self-pay | Admitting: Radiology

## 2023-09-18 DIAGNOSIS — M79631 Pain in right forearm: Secondary | ICD-10-CM | POA: Diagnosis not present

## 2023-09-18 DIAGNOSIS — M79601 Pain in right arm: Secondary | ICD-10-CM | POA: Diagnosis not present

## 2023-09-18 DIAGNOSIS — E039 Hypothyroidism, unspecified: Secondary | ICD-10-CM | POA: Diagnosis not present

## 2023-09-18 LAB — CBC WITH DIFFERENTIAL/PLATELET
Abs Immature Granulocytes: 0.03 10*3/uL (ref 0.00–0.07)
Basophils Absolute: 0.1 10*3/uL (ref 0.0–0.1)
Basophils Relative: 1 %
Eosinophils Absolute: 0.5 10*3/uL (ref 0.0–0.5)
Eosinophils Relative: 6 %
HCT: 44 % (ref 36.0–46.0)
Hemoglobin: 14.9 g/dL (ref 12.0–15.0)
Immature Granulocytes: 0 %
Lymphocytes Relative: 24 %
Lymphs Abs: 1.8 10*3/uL (ref 0.7–4.0)
MCH: 32.9 pg (ref 26.0–34.0)
MCHC: 33.9 g/dL (ref 30.0–36.0)
MCV: 97.1 fL (ref 80.0–100.0)
Monocytes Absolute: 0.5 10*3/uL (ref 0.1–1.0)
Monocytes Relative: 6 %
Neutro Abs: 4.6 10*3/uL (ref 1.7–7.7)
Neutrophils Relative %: 63 %
Platelets: 272 10*3/uL (ref 150–400)
RBC: 4.53 MIL/uL (ref 3.87–5.11)
RDW: 12 % (ref 11.5–15.5)
WBC: 7.5 10*3/uL (ref 4.0–10.5)
nRBC: 0 % (ref 0.0–0.2)

## 2023-09-18 LAB — BASIC METABOLIC PANEL
Anion gap: 11 (ref 5–15)
BUN: 11 mg/dL (ref 6–20)
CO2: 25 mmol/L (ref 22–32)
Calcium: 9.8 mg/dL (ref 8.9–10.3)
Chloride: 100 mmol/L (ref 98–111)
Creatinine, Ser: 0.74 mg/dL (ref 0.44–1.00)
GFR, Estimated: >60 mL/min (ref >60)
Glucose, Bld: 98 mg/dL (ref 70–99)
Potassium: 4 mmol/L (ref 3.5–5.1)
Sodium: 136 mmol/L (ref 135–145)

## 2023-09-18 MED ORDER — IBUPROFEN 800 MG PO TABS
800.0000 mg | ORAL_TABLET | Freq: Once | ORAL | Status: AC
  Administered 2023-09-18: 800 mg via ORAL
  Filled 2023-09-18: qty 1

## 2023-09-18 MED ORDER — IBUPROFEN 600 MG PO TABS: 600.0000 mg | ORAL_TABLET | Freq: Three times a day (TID) | ORAL | 0 refills | Status: AC

## 2023-09-18 NOTE — ED Notes (Signed)
Pt sitting in bed with family at bedside. Pt stated arm is in pain. 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 Able to take jacket off without assistance. Able to wiggle right fingers. Capillary refill less than 3 seconds.

## 2023-09-18 NOTE — ED Triage Notes (Signed)
Pt states she woke this morning with right arm pain from the wrist to the elbow. Pt states no injury. When she went to bed last night it was fine and woke with the pain. Pt states she has tried tylenol, heat and cold and nothing is working.

## 2023-09-18 NOTE — ED Provider Notes (Signed)
[place] [facility] AT [facility] Provider Note   CSN: [id number] Arrival date & time: 09/18/23  2024     History {Add pertinent medical, surgical, social history, OB history to HPI:1} Chief Complaint  Patient presents with   Arm Pain    Gail Lee is a 27 y.o. female with a history of hypothyroidism and PCOS presenting for evaluation of right forearm pain.  She denies injury, states she went to bed last night with no symptoms and when she woke around 9 AM this morning she woke with severe pain in her right volar forearm.  Her pain is deep, constant and intense, it is worsened with movement, especially with flexion of her wrist.  As mentioned she denies any injury or specific overuse.  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  Denies fevers or chills, no swelling, redness, rash.  She has tried Tylenol and also has employed heat and cold without any improvement in her symptoms.  The history is provided by the patient.       Home Medications Prior to Admission medications   Medication Sig Start Date End Date Taking? Authorizing Provider  levothyroxine (SYNTHROID) 50 MCG tablet Take 50 mcg by mouth daily before breakfast.    [provider]  ondansetron (ZOFRAN) 4 MG tablet Take 1 tablet (4 mg total) by mouth every 6 (six) hours as needed for nausea. 07/06/21   Lucretia Roers, MD  oxyCODONE (OXY IR/ROXICODONE) 5 MG immediate release tablet Take 1 tablet (5 mg total) by mouth every 4 (four) hours as needed for severe pain or breakthrough pain. 07/06/21   Lucretia Roers, MD      Allergies    Patient has no known allergies.    Review of Systems   Review of Systems  Constitutional:  Negative for chills and fever.  Musculoskeletal:  Positive for arthralgias. Negative for joint swelling and myalgias.  Skin:  Negative for rash.  Neurological:  Negative for weakness and numbness.    Physical  Exam Updated Vital Signs BP 136/84 (BP Location: Left Arm)   Pulse (!) 103   Temp 98.7 F (37.1 C) (Oral)   Resp 17   Ht 5\' 1"  (1.549 m)   Wt 68 kg   SpO2 92%   BMI 28.34 kg/m  Physical Exam Constitutional:      Appearance: She is well-developed.  HENT:     Head: Atraumatic.  Cardiovascular:     Comments: Pulses equal bilaterally Musculoskeletal:        General: Tenderness present.     Cervical back: Normal range of motion.     Comments: Mild tenderness to palpation along the radial volar aspect of her right forearm.  There is no edema, erythema, rash or any sign of skin injury.  Pain is worsened with wrist flexion, not so much with elbow flexion.  There is no red streaking, no lymphadenopathy.  Radial pulses full.  Cap refill in fingertips is less than 2 seconds.  Skin:    General: Skin is warm and dry.  Neurological:     Mental Status: She is alert.     Sensory: No sensory deficit.     Motor: No weakness.     Deep Tendon Reflexes: Reflexes normal.     ED Results / Procedures / Treatments   Labs (all labs ordered are listed, but only abnormal results are displayed) Labs Reviewed - No data  to display  EKG None  Radiology No results found.  Procedures Procedures  {Document cardiac monitor, telemetry assessment procedure when appropriate:1}  Medications Ordered in ED Medications - No data to display  ED Course/ Medical Decision Making/ A&P   {   Click here for ABCD2, HEART and other calculatorsREFRESH Note before signing :1}                              Medical Decision Making    {Document critical care time when appropriate:1} {Document review of labs and clinical decision tools ie heart score, Chads2Vasc2 etc:1}  {Document your independent review of radiology images, and any outside records:1} {Document your discussion with family members, caretakers, and with consultants:1} {Document social determinants of health affecting pt's care:1} {Document your  decision making why or why not admission, treatments were needed:1} Final Clinical Impression(s) / ED Diagnoses Final diagnoses:  None    Rx / DC Orders ED Discharge Orders     None

## 2023-09-18 NOTE — Discharge Instructions (Signed)
There are several possible reasons for the pain in your forearm, first this could be a tendon injury from your chronic repetitive motion that you do at work, also this could lead to carpal tunnel syndrome which may also be part of the problem.  You may need further tests if your symptoms do not improve with simple treatments.  I do recommend wearing the wrist brace at night to rest your wrist joint while you are sleeping.  Also I recommend using ibuprofen for pain and inflammation and use ice is much as is comfortable.

## 2023-09-25 ENCOUNTER — Emergency Department (HOSPITAL_COMMUNITY)
Admission: EM | Admit: 2023-09-25 | Discharge: 2023-09-26 | Disposition: A | Discharge status: Home / Self Care | Payer: BC Managed Care – PPO | Attending: Emergency Medicine | Admitting: Emergency Medicine

## 2023-09-25 ENCOUNTER — Emergency Department (HOSPITAL_COMMUNITY): Payer: BC Managed Care – PPO

## 2023-09-25 ENCOUNTER — Other Ambulatory Visit: Payer: Self-pay

## 2023-09-25 ENCOUNTER — Encounter (HOSPITAL_COMMUNITY): Payer: Self-pay

## 2023-09-25 DIAGNOSIS — R0981 Nasal congestion: Secondary | ICD-10-CM | POA: Insufficient documentation

## 2023-09-25 DIAGNOSIS — J029 Acute pharyngitis, unspecified: Secondary | ICD-10-CM | POA: Diagnosis not present

## 2023-09-25 DIAGNOSIS — R059 Cough, unspecified: Secondary | ICD-10-CM | POA: Diagnosis not present

## 2023-09-25 DIAGNOSIS — Z20822 Contact with and (suspected) exposure to covid-19: Secondary | ICD-10-CM | POA: Insufficient documentation

## 2023-09-25 LAB — RESP PANEL BY RT-PCR (RSV, FLU A&B, COVID)  RVPGX2
Influenza A by PCR: NEGATIVE
Influenza B by PCR: NEGATIVE
Resp Syncytial Virus by PCR: NEGATIVE
SARS Coronavirus 2 by RT PCR: NEGATIVE

## 2023-09-25 LAB — GROUP A STREP BY PCR: Group A Strep by PCR: NOT DETECTED

## 2023-09-25 NOTE — ED Triage Notes (Signed)
Cough and sore throat increasing over the last couple of days  Pt stated her tonsils are swollen and both sides of the back of throat have white discharge.   Denies fever at home

## 2023-09-26 LAB — MONONUCLEOSIS SCREEN: Mono Screen: NEGATIVE

## 2023-09-26 MED ORDER — AMOXICILLIN-POT CLAVULANATE 875-125 MG PO TABS: 1.0000 | ORAL_TABLET | Freq: Two times a day (BID) | ORAL | 0 refills | Status: DC

## 2023-09-26 MED ORDER — AMOXICILLIN-POT CLAVULANATE 875-125 MG PO TABS
1.0000 | ORAL_TABLET | Freq: Once | ORAL | Status: AC
  Administered 2023-09-26: 1 via ORAL
  Filled 2023-09-26: qty 1

## 2023-09-26 NOTE — ED Provider Notes (Signed)
[place] EM[facility] DEPARTMENT AT [facility]  Provider Note  CSN: [id number] Arrival date & time: 09/25/23 2213  History Chief Complaint  Patient presents with   Sore Throat    Gail Lee is a 27 y.o. female reports she has been sick with cough, congestion and sore throat for about a week. No fevers recently. She noticed white material on her tonsils today and became concerned for possible strep. She has had strep multiple times in the past.    Home Medications Prior to Admission medications   Medication Sig Start Date End Date Taking? Authorizing Provider  amoxicillin-clavulanate (AUGMENTIN) 875-125 MG tablet Take 1 tablet by mouth every 12 (twelve) hours. 09/26/23  Yes Pollyann Savoy, MD  ibuprofen (ADVIL) 600 MG tablet Take 1 tablet (600 mg total) by mouth 3 (three) times daily. 09/18/23   Burgess Amor, PA-C  levothyroxine (SYNTHROID) 50 MCG tablet Take 50 mcg by mouth daily before breakfast.    [provider]  ondansetron (ZOFRAN) 4 MG tablet Take 1 tablet (4 mg total) by mouth every 6 (six) hours as needed for nausea. 07/06/21   Lucretia Roers, MD  oxyCODONE (OXY IR/ROXICODONE) 5 MG immediate release tablet Take 1 tablet (5 mg total) by mouth every 4 (four) hours as needed for severe pain or breakthrough pain. 07/06/21   Lucretia Roers, MD     Allergies    Patient has no known allergies.   Review of Systems   Review of Systems Please see HPI for pertinent positives and negatives  Physical Exam BP 115/88 (BP Location: Right Arm)   Pulse (!) 101   Temp 98.9 F (37.2 C) (Oral)   Resp 16   Ht 5\' 1"  (1.549 m)   Wt 68 kg   SpO2 91%   BMI 28.34 kg/m   Physical Exam Vitals and nursing note reviewed.  HENT:     Head: Normocephalic.     Nose: Nose normal.     Mouth/Throat:     Tonsils: Tonsillar exudate present. 2+ on the right. 2+ on the left.  Eyes:     Extraocular Movements: Extraocular movements intact.  Pulmonary:     Effort:  Pulmonary effort is normal.  Musculoskeletal:        General: Normal range of motion.     Cervical back: Neck supple.  Skin:    Findings: No rash (on exposed skin).  Neurological:     Mental Status: She is alert and oriented to person, place, and time.  Psychiatric:        Mood and Affect: Mood normal.     ED Results / Procedures / Treatments   EKG None  Procedures Procedures  Medications Ordered in the ED Medications  amoxicillin-clavulanate (AUGMENTIN) 875-125 MG per tablet 1 tablet (has no administration in time range)    Initial Impression and Plan  Patient here with 2 weeks of URI symptoms, now with swollen tonsils with exudate. She had labs done in triage showing negative Covid/Flu/RSV and swab, negative monospot. I personally viewed the images from radiology studies and agree with radiologist interpretation: CXR is clear. Patient's symptoms likely viral but given the appearance of her tonsils and the duration of her symptoms, will give a trial of Abx to see if it improves. Otherwise continued supportive care OTC. PCP follow up, RTED for any other concerns.    ED Course       MDM Rules/Calculators/A&P Medical Decision Making Problems Addressed: Pharyngitis, unspecified etiology: acute  illness or injury  Amount and/or Complexity of Data Reviewed Labs: ordered. Decision-making details documented in ED Course. Radiology: ordered and independent interpretation performed. Decision-making details documented in ED Course.  Risk OTC drugs. Prescription drug management.     Final Clinical Impression(s) / ED Diagnoses Final diagnoses:  Pharyngitis, unspecified etiology    Rx / DC Orders ED Discharge Orders          Ordered    amoxicillin-clavulanate (AUGMENTIN) 875-125 MG tablet  Every 12 hours        09/26/23 0025             Pollyann Savoy, MD 09/26/23 0025

## 2024-08-24 ENCOUNTER — Encounter (HOSPITAL_COMMUNITY): Payer: Self-pay

## 2024-08-24 ENCOUNTER — Emergency Department (HOSPITAL_COMMUNITY)
Admission: EM | Admit: 2024-08-24 | Discharge: 2024-08-24 | Disposition: A | Discharge status: Home / Self Care | Attending: Emergency Medicine | Admitting: Emergency Medicine

## 2024-08-24 ENCOUNTER — Other Ambulatory Visit: Payer: Self-pay

## 2024-08-24 DIAGNOSIS — J45909 Unspecified asthma, uncomplicated: Secondary | ICD-10-CM | POA: Diagnosis not present

## 2024-08-24 DIAGNOSIS — T7840XA Allergy, unspecified, initial encounter: Secondary | ICD-10-CM | POA: Insufficient documentation

## 2024-08-24 DIAGNOSIS — R062 Wheezing: Secondary | ICD-10-CM | POA: Diagnosis not present

## 2024-08-24 DIAGNOSIS — R0602 Shortness of breath: Secondary | ICD-10-CM | POA: Diagnosis not present

## 2024-08-24 MED ORDER — PREDNISONE 20 MG PO TABS: 40.0000 mg | ORAL_TABLET | Freq: Every day | ORAL | 0 refills | Status: AC

## 2024-08-24 MED ORDER — ALBUTEROL SULFATE HFA 108 (90 BASE) MCG/ACT IN AERS: 2.0000 | INHALATION_SPRAY | RESPIRATORY_TRACT | 1 refills | Status: AC | PRN

## 2024-08-24 MED ORDER — DIPHENHYDRAMINE HCL 50 MG/ML IJ SOLN
25.0000 mg | Freq: Once | INTRAMUSCULAR | Status: AC
  Administered 2024-08-24: 25 mg via INTRAVENOUS
  Filled 2024-08-24: qty 1

## 2024-08-24 MED ORDER — METHYLPREDNISOLONE SODIUM SUCC 125 MG IJ SOLR
125.0000 mg | Freq: Once | INTRAMUSCULAR | Status: AC
  Administered 2024-08-24: 125 mg via INTRAVENOUS
  Filled 2024-08-24: qty 2

## 2024-08-24 MED ORDER — IPRATROPIUM-ALBUTEROL 0.5-2.5 (3) MG/3ML IN SOLN
3.0000 mL | Freq: Once | RESPIRATORY_TRACT | Status: AC
  Administered 2024-08-24: 3 mL via RESPIRATORY_TRACT
  Filled 2024-08-24: qty 3

## 2024-08-24 MED ORDER — DIPHENHYDRAMINE HCL 25 MG PO TABS: 25.0000 mg | ORAL_TABLET | Freq: Four times a day (QID) | ORAL | 0 refills | Status: AC

## 2024-08-24 MED ORDER — SODIUM CHLORIDE 0.9 % IV BOLUS
1000.0000 mL | Freq: Once | INTRAVENOUS | Status: AC
  Administered 2024-08-24: 1000 mL via INTRAVENOUS

## 2024-08-24 MED ORDER — EPINEPHRINE 0.3 MG/0.3ML IJ SOAJ: 0.3000 mg | INTRAMUSCULAR | 1 refills | Status: AC | PRN

## 2024-08-24 MED ORDER — FAMOTIDINE IN NACL 20-0.9 MG/50ML-% IV SOLN
20.0000 mg | Freq: Once | INTRAVENOUS | Status: AC
  Administered 2024-08-24: 20 mg via INTRAVENOUS
  Filled 2024-08-24: qty 50

## 2024-08-24 NOTE — Discharge Instructions (Signed)
 You were evaluated in the Emergency Department and after careful evaluation, we did not find any emergent condition requiring admission or further testing in the hospital.  Your exam/testing today is overall reassuring.  Symptoms may have been due to an allergic reaction or a flare of your asthma triggered by the nail product.  Can use the albuterol inhaler as needed for wheezing.  Take the prednisone steroid medication daily with first home dose morning of 11-7.  Can use the Benadryl  as needed for itching or rash.  Reserve the EpiPen only for severe worsening, severe shortness of breath, or sensation of throat closure.  If you use the EpiPen you will need to return to the emergency department.  Important that you avoid this nail product in the future.  Please return to the Emergency Department if you experience any worsening of your condition.   Thank you for allowing us  to be a part of your care.

## 2024-08-24 NOTE — ED Notes (Signed)
 ED Provider at bedside.

## 2024-08-24 NOTE — ED Provider Notes (Signed)
 [facility] Emergency Department Provider Note MRN:  [id number]  Arrival date & time: 08/24/24     Chief Complaint   Shortness of breath History of Present Illness   Gail Lee is a 28 y.o. year-old female with no pertinent past medical history presenting to the ED with chief complaint of shortness of breath.  Patient explains that she was applying nail DIP powder to her nails and shortly after she began feeling wheezing and shortness of breath.  Change to her voice mildly at first.  Progressively worsening, here for evaluation for possible allergic reaction.  Denies rash, no nausea vomiting, no tongue swelling.  Review of Systems  A thorough review of systems was obtained and all systems are negative except as noted in the HPI and PMH.   Patient's Health History    Past Medical History:  Diagnosis Date   Headache(784.0)    PCOS (polycystic ovarian syndrome)    Thyroid  disease     Past Surgical History:  Procedure Laterality Date   LAPAROSCOPIC APPENDECTOMY N/A 07/06/2021   Procedure: APPENDECTOMY LAPAROSCOPIC;  Surgeon: Kallie Manuelita BROCKS, MD;  Location: [facility];  Service: General;  Laterality: N/A;    History reviewed. No pertinent family history.  Social History   Socioeconomic History   Marital status: Married    Spouse name: Not on file   Number of children: Not on file   Years of education: Not on file   Highest education level: Not on file  Occupational History   Not on file  Tobacco Use   Smoking status: Never   Smokeless tobacco: Never  Vaping Use   Vaping status: Never Used  Substance and Sexual Activity   Alcohol use: No   Drug use: No   Sexual activity: Yes    Birth control/protection: None  Other Topics Concern   Not on file  Social History Narrative   Not on file   Social Drivers of Health   Financial Resource Strain: Not on file  Food Insecurity: Not on file  Transportation Needs: Not on file  Physical Activity:  Not on file  Stress: Not on file  Social Connections: Not on file  Intimate Partner Violence: Not on file     Physical Exam   Vitals:   08/24/24 0415 08/24/24 0430  BP: 107/72 109/78  Pulse: 84 93  Resp:  20  Temp:    SpO2: 97% 98%    CONSTITUTIONAL: Well-appearing, NAD NEURO/PSYCH:  Alert and oriented x 3, no focal deficits EYES:  eyes equal and reactive ENT/NECK:  no LAD, no JVD CARDIO: Regular rate, well-perfused, normal S1 and S2 PULM: Diffuse wheezing in all fields, no increased work of breathing GI/GU:  non-distended, non-tender MSK/SPINE:  No gross deformities, no edema SKIN:  no rash, atraumatic   *Additional and/or pertinent findings included in MDM below  Diagnostic and Interventional Summary    EKG Interpretation Date/Time:    Ventricular Rate:    PR Interval:    QRS Duration:    QT Interval:    QTC Calculation:   R Axis:      Text Interpretation:         Labs Reviewed - No data to display  No orders to display    Medications  methylPREDNISolone sodium succinate (SOLU-MEDROL) 125 mg/2 mL injection 125 mg (125 mg Intravenous Given 08/24/24 0306)  diphenhydrAMINE  (BENADRYL ) injection 25 mg (25 mg Intravenous Given 08/24/24 0305)  famotidine (PEPCID) IVPB 20 mg premix (0 mg Intravenous Stopped  08/24/24 0358)  sodium chloride  0.9 % bolus 1,000 mL (1,000 mLs Intravenous New Bag/Given 08/24/24 0305)  ipratropium-albuterol (DUONEB) 0.5-2.5 (3) MG/3ML nebulizer solution 3 mL (3 mLs Nebulization Given 08/24/24 9688)     Procedures  /  Critical Care Procedures  ED Course and Medical Decision Making  Initial Impression and Ddx Concern for allergic reaction or acute asthma exacerbation related to new exposure, female DIP powder.  She denies inhaling a large amount.  She is not having any signs of angioedema, no signs of obvious anaphylaxis but concerning that she has such diffuse wheezing.  Providing antihistamines, steroids, monitoring closely.  Normal-appearing  oropharynx.  No rash, no nausea or vomiting.  Past medical/surgical history that increases complexity of ED encounter: History of asthma  Interpretation of Diagnostics Laboratory and/or imaging options to aid in the diagnosis/care of the patient were considered.  After careful history and physical examination, it was determined that there was no indication for diagnostics at this time.  Patient Reassessment and Ultimate Disposition/Management     On multiple reassessments patient is doing much better, wheezing is resolved, resting comfortably, normal vitals.  Stable for discharge with return precautions.  Patient management required discussion with the following services or consulting groups:  None  Complexity of Problems Addressed Acute illness or injury that poses threat of life of bodily function  Additional Data Reviewed and Analyzed Further history obtained from: Further history from spouse/family member  Additional Factors Impacting ED Encounter Risk Consideration of hospitalization  Ozell HERO. Theadore, MD [facility] Emergency Medicine [facility] [email or web address]  Final Clinical Impressions(s) / ED Diagnoses     ICD-10-CM   1. Wheezing  R06.2     2. Allergic reaction to substance  T78.40XA       ED Discharge Orders          Ordered    diphenhydrAMINE  (BENADRYL ) 25 MG tablet  Every 6 hours        08/24/24 0447    predniSONE (DELTASONE) 20 MG tablet  Daily        08/24/24 0447    albuterol (VENTOLIN HFA) 108 (90 Base) MCG/ACT inhaler  Every 4 hours PRN        08/24/24 0447    EPINEPHrine 0.3 mg/0.3 mL IJ SOAJ injection  As needed        08/24/24 0447             Discharge Instructions Discussed with and Provided to Patient:     Discharge Instructions      You were evaluated in the Emergency Department and after careful evaluation, we did not find any emergent condition requiring admission or further testing in the hospital.  Your  exam/testing today is overall reassuring.  Symptoms may have been due to an allergic reaction or a flare of your asthma triggered by the nail product.  Can use the albuterol inhaler as needed for wheezing.  Take the prednisone steroid medication daily with first home dose morning of 11-7.  Can use the Benadryl  as needed for itching or rash.  Reserve the EpiPen only for severe worsening, severe shortness of breath, or sensation of throat closure.  If you use the EpiPen you will need to return to the emergency department.  Important that you avoid this nail product in the future.  Please return to the Emergency Department if you experience any worsening of your condition.   Thank you for allowing us  to be a part of your care.  Theadore Ozell HERO, MD 08/24/24 [phone]

## 2024-08-24 NOTE — ED Triage Notes (Signed)
 Pov from home. Cc of possible allergic reaction to a dip powder that she used on her nails.  Says that after that she became short of breath/coughing/difficult to take a deep breath. Hx of asthma but hasn't had an attack since high school

## 2024-09-06 ENCOUNTER — Encounter: Payer: Self-pay | Admitting: Emergency Medicine

## 2024-09-06 ENCOUNTER — Ambulatory Visit
Admission: EM | Admit: 2024-09-06 | Discharge: 2024-09-06 | Disposition: A | Discharge status: Home / Self Care | Attending: Family Medicine | Admitting: Family Medicine

## 2024-09-06 DIAGNOSIS — J069 Acute upper respiratory infection, unspecified: Secondary | ICD-10-CM | POA: Diagnosis not present

## 2024-09-06 LAB — POCT RAPID STREP A (OFFICE): Rapid Strep A Screen: NEGATIVE

## 2024-09-06 MED ORDER — AZELASTINE HCL 0.1 % NA SOLN: 1.0000 | Freq: Two times a day (BID) | NASAL | 0 refills | Status: AC

## 2024-09-06 MED ORDER — LIDOCAINE VISCOUS HCL 2 % MT SOLN: 10.0000 mL | OROMUCOSAL | 0 refills | Status: AC | PRN

## 2024-09-06 NOTE — ED Provider Notes (Signed)
 [facility]    CSN: [id number] Arrival date & time: 09/06/24  1141      History   Chief Complaint No chief complaint on file.   HPI Gail Lee is a 28 y.o. female.   Patient presenting today with 1 day history of sore throat, bilateral ear pressure, congestion, cough.  Denies fever, chills, chest pain, shortness of breath, abdominal pain, vomiting, diarrhea.  So far trying ibuprofen  with minimal relief.  States she has a history of frequent strep infections and wanting to rule this out.    Past Medical History:  Diagnosis Date   Headache(784.0)    PCOS (polycystic ovarian syndrome)    Thyroid  disease     Patient Active Problem List   Diagnosis Date Noted   Appendicitis 07/05/2021    Past Surgical History:  Procedure Laterality Date   LAPAROSCOPIC APPENDECTOMY N/A 07/06/2021   Procedure: APPENDECTOMY LAPAROSCOPIC;  Surgeon: Kallie Manuelita BROCKS, MD;  Location: [facility];  Service: General;  Laterality: N/A;    OB History   No obstetric history on file.      Home Medications    Prior to Admission medications   Medication Sig Start Date End Date Taking? Authorizing Provider  azelastine (ASTELIN) 0.1 % nasal spray Place 1 spray into both nostrils 2 (two) times daily. Use in each nostril as directed 09/06/24  Yes Stuart Vernell Norris, PA-C  lidocaine  (XYLOCAINE ) 2 % solution Use as directed 10 mLs in the mouth or throat every 3 (three) hours as needed. 09/06/24  Yes Stuart Vernell Norris, PA-C  albuterol (VENTOLIN HFA) 108 (90 Base) MCG/ACT inhaler Inhale 2 puffs into the lungs every 4 (four) hours as needed for wheezing or shortness of breath. 08/24/24   Theadore Ozell HERO, MD  diphenhydrAMINE  (BENADRYL ) 25 MG tablet Take 1 tablet (25 mg total) by mouth every 6 (six) hours. 08/24/24   Theadore Ozell HERO, MD  EPINEPHrine 0.3 mg/0.3 mL IJ SOAJ injection Inject 0.3 mg into the muscle as needed for anaphylaxis. 08/24/24   Theadore Ozell HERO, MD  ibuprofen  (ADVIL ) 600  MG tablet Take 1 tablet (600 mg total) by mouth 3 (three) times daily. 09/18/23   Idol, Julie, PA-C  levothyroxine  (SYNTHROID ) 50 MCG tablet Take 50 mcg by mouth daily before breakfast.    [provider]    Family History History reviewed. No pertinent family history.  Social History Social History   Tobacco Use   Smoking status: Never   Smokeless tobacco: Never  Vaping Use   Vaping status: Never Used  Substance Use Topics   Alcohol use: No   Drug use: No     Allergies   Patient has no known allergies.   Review of Systems Review of Systems PER HPI  Physical Exam Triage Vital Signs ED Triage Vitals  Encounter Vitals Group     BP 09/06/24 1146 130/80     Girls Systolic BP Percentile --      Girls Diastolic BP Percentile --      Boys Systolic BP Percentile --      Boys Diastolic BP Percentile --      Pulse Rate 09/06/24 1146 91     Resp 09/06/24 1146 18     Temp 09/06/24 1146 98.1 F (36.7 C)     Temp Source 09/06/24 1146 Oral     SpO2 09/06/24 1146 96 %     Weight --      Height --      Head Circumference --  Peak Flow --      Pain Score 09/06/24 1147 2     Pain Loc --      Pain Education --      Exclude from Growth Chart --    No data found.  Updated Vital Signs BP 130/80 (BP Location: Right Arm)   Pulse 91   Temp 98.1 F (36.7 C) (Oral)   Resp 18   LMP 08/27/2024 (Exact Date)   SpO2 96%   Visual Acuity Right Eye Distance:   Left Eye Distance:   Bilateral Distance:    Right Eye Near:   Left Eye Near:    Bilateral Near:     Physical Exam Vitals and nursing note reviewed.  Constitutional:      Appearance: Normal appearance.  HENT:     Head: Atraumatic.     Right Ear: Tympanic membrane and external ear normal.     Left Ear: Tympanic membrane and external ear normal.     Nose: Rhinorrhea present.     Mouth/Throat:     Mouth: Mucous membranes are moist.     Pharynx: Posterior oropharyngeal erythema present. No oropharyngeal  exudate.  Eyes:     Extraocular Movements: Extraocular movements intact.     Conjunctiva/sclera: Conjunctivae normal.  Cardiovascular:     Rate and Rhythm: Normal rate and regular rhythm.     Heart sounds: Normal heart sounds.  Pulmonary:     Effort: Pulmonary effort is normal.     Breath sounds: Normal breath sounds. No wheezing or rales.  Musculoskeletal:        General: Normal range of motion.     Cervical back: Normal range of motion and neck supple.  Skin:    General: Skin is warm and dry.  Neurological:     Mental Status: She is alert and oriented to person, place, and time.  Psychiatric:        Mood and Affect: Mood normal.        Thought Content: Thought content normal.      UC Treatments / Results  Labs (all labs ordered are listed, but only abnormal results are displayed) Labs Reviewed  POCT RAPID STREP A (OFFICE)    EKG   Radiology No results found.  Procedures Procedures (including critical care time)  Medications Ordered in UC Medications - No data to display  Initial Impression / Assessment and Plan / UC Course  I have reviewed the triage vital signs and the nursing notes.  Pertinent labs & imaging results that were available during my care of the patient were reviewed by me and considered in my medical decision making (see chart for details).     Vitals and exam are reassuring today, rapid strep negative.  Suspect viral respiratory infection.  Will treat with viscous lidocaine , Astelin, supportive over-the-counter medications at home care.  Return for worsening or unresolving symptoms.  Final Clinical Impressions(s) / UC Diagnoses   Final diagnoses:  Viral URI with cough   Discharge Instructions   None    ED Prescriptions     Medication Sig Dispense Auth. Provider   lidocaine  (XYLOCAINE ) 2 % solution Use as directed 10 mLs in the mouth or throat every 3 (three) hours as needed. 100 mL Stuart Vernell Norris, PA-C   azelastine (ASTELIN)  0.1 % nasal spray Place 1 spray into both nostrils 2 (two) times daily. Use in each nostril as directed 30 mL Stuart Vernell Norris, PA-C      PDMP not reviewed this  encounter.   Stuart Vernell Norris, PA-C 09/06/24 1225

## 2024-09-06 NOTE — ED Triage Notes (Signed)
Sore throat and bilateral ear pain since yesterday.
